# Patient Record
Sex: Female | Born: 1935 | Race: Black or African American | Hispanic: No | Marital: Married | Smoking: Current every day smoker
Health system: Southern US, Community
[De-identification: ages and names within clinical notes are randomized; demographics above are authoritative.]

---

## 2001-06-11 ENCOUNTER — Encounter: Payer: Self-pay | Admitting: *Deleted

## 2001-06-11 ENCOUNTER — Encounter: Admission: RE | Admit: 2001-06-11 | Discharge: 2001-06-11 | Payer: Self-pay | Admitting: *Deleted

## 2007-05-19 ENCOUNTER — Ambulatory Visit (HOSPITAL_COMMUNITY): Admission: RE | Admit: 2007-05-19 | Discharge: 2007-05-19 | Payer: Self-pay | Admitting: Family Medicine

## 2007-06-02 ENCOUNTER — Encounter: Admission: RE | Admit: 2007-06-02 | Discharge: 2007-06-02 | Payer: Self-pay | Admitting: Family Medicine

## 2007-06-17 ENCOUNTER — Encounter (INDEPENDENT_AMBULATORY_CARE_PROVIDER_SITE_OTHER): Payer: Self-pay | Admitting: Surgery

## 2007-06-17 ENCOUNTER — Ambulatory Visit (HOSPITAL_COMMUNITY): Admission: RE | Admit: 2007-06-17 | Discharge: 2007-06-18 | Payer: Self-pay | Admitting: Surgery

## 2007-12-17 ENCOUNTER — Encounter: Admission: RE | Admit: 2007-12-17 | Discharge: 2007-12-17 | Payer: Self-pay | Admitting: Rheumatology

## 2008-01-28 ENCOUNTER — Ambulatory Visit (HOSPITAL_BASED_OUTPATIENT_CLINIC_OR_DEPARTMENT_OTHER): Admission: RE | Admit: 2008-01-28 | Discharge: 2008-01-28 | Payer: Self-pay | Admitting: Orthopedic Surgery

## 2008-03-17 ENCOUNTER — Ambulatory Visit (HOSPITAL_BASED_OUTPATIENT_CLINIC_OR_DEPARTMENT_OTHER): Admission: RE | Admit: 2008-03-17 | Discharge: 2008-03-17 | Payer: Self-pay | Admitting: Orthopedic Surgery

## 2009-04-07 ENCOUNTER — Encounter: Admission: RE | Admit: 2009-04-07 | Discharge: 2009-04-07 | Payer: Self-pay | Admitting: Internal Medicine

## 2009-04-14 ENCOUNTER — Encounter: Admission: RE | Admit: 2009-04-14 | Discharge: 2009-04-14 | Payer: Self-pay | Admitting: Internal Medicine

## 2009-04-22 IMAGING — NM NM TUMOR LOCAL/TRACER DISTR WITH SPECT
1 series · 6 of 6 positions shown · non-contrast
Comparison: None.

CLINICAL DATA: Elevated parathyroid hormone.  Elevated calcium.  Evaluate for parathyroid adenoma. 
NM PARATHYROID SCINTIGRAPHY AND SPECT IMAGING:
TECHNIQUE: Following intravenous administration of radiopharmaceutical, early and 2-hour delayed planar images were obtained in the anterior projection.  Delayed triplanar SPECT images were also obtained at 2 hours.  
Radiopharmaceutical:  20.9 mCi 2c-99m sestamibi IV

[(hospital) non circ ect combin · 3.19mm/px · 6 of 120 frames shown]
[frame 11/120  full-range]
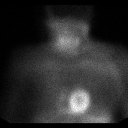
[frame 31/120  full-range]
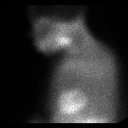
[frame 51/120  full-range]
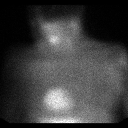
[frame 71/120  full-range]
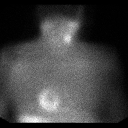
[frame 91/120  full-range]
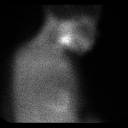
[frame 111/120  full-range]
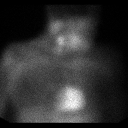

[6 of 6 positions shown; findings below may reference images not displayed]

FINDINGS: On the immediate and 60 minute delayed images there is a medium sized focus of moderate increased radiotracer uptake which localizes to the expected location of the lower pole of the right lobe of the thyroid gland.  
  Very faint radiotracer uptake in the distribution of the left lobe of the thyroid gland is noted.  
  On the 120 minute delayed images there is only mild washout of radiotracer from the expected location of the lower pole of the right lobe of the thyroid gland.
IMPRESSION: 1.  There is a medium sized focus of persistent radiotracer activity localizing to the lower pole of the right lobe of the thyroid gland.  Radiotracer uptake within a normal appearing thyroid gland is not noted.  In the appropriate clinical setting this may represent a parathyroid adenoma. 
2.  Relative decreased radiotracer activity within the upper pole of the right lobe of the thyroid and the entire left lobe of the thyroid is noted.  Correlation with thyroid disease is recommended.

## 2009-04-24 ENCOUNTER — Encounter: Admission: RE | Admit: 2009-04-24 | Discharge: 2009-04-24 | Payer: Self-pay | Admitting: Internal Medicine

## 2010-05-15 ENCOUNTER — Encounter
Admission: RE | Admit: 2010-05-15 | Discharge: 2010-05-15 | Payer: Self-pay | Source: Home / Self Care | Attending: Family Medicine | Admitting: Family Medicine

## 2010-06-03 ENCOUNTER — Encounter: Payer: Self-pay | Admitting: Internal Medicine

## 2010-09-25 NOTE — Op Note (Signed)
NAMEMARDA, Connie Becker                 ACCOUNT NO.:  [id number]   MEDICAL RECORD NO.:  [id number]          PATIENT TYPE:  OIB   LOCATION:  5120                         FACILITY:  [facility]   PHYSICIAN:  Velora Heckler, MD      DATE OF BIRTH:  Jan 13, 1936   DATE OF PROCEDURE:  06/17/2007  DATE OF DISCHARGE:                               OPERATIVE REPORT   PREOPERATIVE DIAGNOSIS:  Primary hyperparathyroidism.   POSTOPERATIVE DIAGNOSIS:  Primary hyperparathyroidism.   PROCEDURE:  Right inferior parathyroidectomy.   SURGEON:  Velora Heckler, MD, FACS   ASSISTANT:  Magnus Ivan, RNFA   ANESTHESIA:  General.   ANESTHESIOLOGIST:  Diamantina Monks, M.D.   ESTIMATED BLOOD LOSS:  Minimal.   PREPARATION:  Betadine.   COMPLICATIONS:  None.   INDICATIONS:  The patient is a 75 year old black female from [place],  [place].  She was found on routine laboratory studies to have  significant hypercalcemia.  Calcium levels have ranged from 10.5 to 13.7  with intact parathyroid hormone levels ranging from 80-151.  Sestamibi  scan performed June 02, 2007 at [facility] localized a  parathyroid adenoma to the right inferior position.  The patient now  comes to surgery for parathyroidectomy.   BODY OF REPORT:  Procedure is done in OR #16 at the [facility].  The patient is brought to the operating room and  placed in a supine position on the operating room table.  Following the  administration of general anesthesia, the patient is positioned and then  prepped and draped in the usual strict aseptic fashion.  After  ascertaining that an adequate level of anesthesia had been achieved, a  right lower neck incision is made with a #10 blade.  Dissection is  carried through subcutaneous tissues and platysma.  Hemostasis is  obtained with the electrocautery.  Subplatysmal flaps are developed  circumferentially.  A Weitlaner  retractor is placed for exposure.  Strap muscles  are incised in the  midline and reflected to the right.  Right thyroid lobe is exposed.  Dissection just beneath the inferior pole of the right lobe shows an  enlarged parathyroid gland; this is gently dissected out.  Vascular  structures are divided between small and medium Ligaclips.  Gland is  completely dissected away from the surrounding structures and excised;  it measures approximately 1.5 cm in greatest dimension.  It is submitted  fresh to Pathology.  Dr. Berneta Levins did a frozen section, confirming  parathyroid tissue consistent with parathyroid adenoma.  Good hemostasis  is noted.  A small piece of Surgicel is placed in the operative field.  Strap muscles are reapproximated in the midline with interrupted 3-0  Vicryl sutures.  Platysma is closed with interrupted 3-0 Vicryl sutures.  Skin is anesthetized with local anesthetic.  Skin is closed with a  running  4-0 Monocryl subcuticular sutures.  Wound is washed and dried and  Benzoin and Steri-Strips are applied.  Sterile dressings are applied.  The patient is awakened from anesthesia and brought to the recovery room  in  stable condition.  The patient tolerated the procedure well.      Velora Heckler, MD  Electronically Signed     TMG/MEDQ  D:  06/17/2007  T:  06/18/2007  Job:  [id number]   cc:   Deboraha Sprang at [facility], Larita Fife NP  Lavonda Jumbo, M.D.

## 2010-09-25 NOTE — Op Note (Signed)
NAME:  Connie Becker, Connie Becker                 ACCOUNT NO.:  [id number]   MEDICAL RECORD NO.:  [id number]          PATIENT TYPE:  AMB   LOCATION:  DSC                          FACILITY:  [facility]   PHYSICIAN:  Katy Fitch. Sypher, M.D. DATE OF BIRTH:  02-12-1936   DATE OF PROCEDURE:  01/28/2008  DATE OF DISCHARGE:                               OPERATIVE REPORT   PREOPERATIVE DIAGNOSIS:  Chronic carpal tunnel syndrome, right hand with  thenar atrophy and background history of possible gout.   POSTOPERATIVE DIAGNOSIS:  Chronic carpal tunnel syndrome, right hand  with thenar atrophy and background history of possible gout.   OPERATIONS:  Release of right transcarpal ligament.   OPERATIONS:  Katy Fitch. Sypher, MD   ASSISTANT:  Marveen Reeks Dasnoit, PA-C   ANESTHESIA:  General by LMA.   SUPERVISING ANESTHESIOLOGIST:  Zenon Mayo, MD   INDICATIONS:  [name] is a 75 year old woman referred through the  courtesy of Dr. Pollyann Savoy and Dr. Joselyn Arrow for evaluation of  chronic right hand pain, thenar atrophy, and loss of sensibility in the  median innervated fingers.   [name] has a history of a probable gout.  She was referred by Dr.  Lynelle Doctor to Dr. Corliss Skains for evaluation and management of hand pain and  arthritis symptoms.  Evaluation suggested possible gout and physical  examination revealed significant thenar atrophy.  Electrodiagnostic  studies were obtained, which revealed profound right carpal tunnel  syndrome and severe left carpal tunnel syndrome.   After detailed informed consent, [name] was brought to the operating  room at this time anticipating release of right transcarpal ligament.   Preoperatively, we pointed out that it would require about 5 months to  see the benefit of decompression of the carpal canal due to her severe  degree of thenar atrophy and numbness.   After informed consent, she was brought to the operating room at this  time.   PROCEDURE:  Brittani Purdum was brought to the operating room and placed in  supine position up on the table.   Following the induction of general anesthesia by LMA technique, the  right arm was prepped with Betadine soap solution and sterilely draped.  A pneumatic tourniquet was applied proximal right brachium.   Following exsanguination of the right arm with Esmarch bandage, arterial  tourniquet was inflated to 200 and 50 mmHg.   Procedure commenced with short incision in line of the ring finger of  the palm.  The subcutaneous tissues were carefully divided revealing the  palmar fascia.  This split longitudinally to the common sensory branch  of the median nerve.   These were followed back to transcarpal ligament, which was gently  isolated from median nerve.  The ligament was then released along its  ulnar border extending into distal forearm.   This widely opened carpal canal.  The tenosynovium was inspected and  found be very edematous.  This had the appearance of a chronic crystal  tenosynovitis.   The wound was then checked for bleeding points and subsequently repaired  with intradermal through a Prolene  suture.   A compressive dressing was applied with a volar plaster splint  maintaining the wrist in 5 degrees of dorsiflexion.   There were no apparent complications.      Katy Fitch Sypher, M.D.  Electronically Signed     RVS/MEDQ  D:  01/28/2008  T:  01/28/2008  Job:  [id number]   cc:   Pollyann Savoy, M.D.  Lavonda Jumbo, M.D.

## 2010-09-25 NOTE — Op Note (Signed)
NAME:  Connie, Becker                 ACCOUNT NO.:  [id number]   MEDICAL RECORD NO.:  [id number]          PATIENT TYPE:  AMB   LOCATION:  DSC                          FACILITY:  [facility]   PHYSICIAN:  Katy Fitch. Sypher, M.D. DATE OF BIRTH:  1935-09-15   DATE OF PROCEDURE:  03/17/2008  DATE OF DISCHARGE:                               OPERATIVE REPORT   PREOPERATIVE DIAGNOSIS:  Entrapment neuropathy, median nerve, left  carpal tunnel.   POSTOPERATIVE DIAGNOSIS:  Entrapment neuropathy, median nerve, left  carpal tunnel.   OPERATION:  Release of left transverse carpal ligament.   OPERATING SURGEON:  Katy Fitch. Sypher, MD   ASSISTANT:  Marveen Reeks Dasnoit, PA-C   ANESTHESIA:  General by LMA.   SUPERVISING ANESTHESIOLOGIST:  Germaine Pomfret, MD   INDICATIONS:  Connie Becker is a 75 year old woman referred for evaluation  and management of severe bilateral carpal tunnel syndrome.  She is  status post release of her right transverse carpal ligament and was  quite pleased with early result.  She now presents for similar surgery  on the left.  Preoperatively, she was advised the risks and benefits of  surgery.  She was interviewed by Dr. Jean Rosenthal who recommended general  anesthesia by LMA technique.  She was noted have no drug allergies  preoperatively.   PROCEDURE:  Connie Becker is brought to the operating room and placed in  supine position upon the operating table.   Following the induction of general anesthesia by LMA technique, the left  arm was prepped with Betadine soap and solution and sterilely draped.  A  pneumatic tourniquet was applied to the proximal left brachium.   Following exsanguination of the left arm with Esmarch bandage, the  arterial tourniquet was inflated to 220 mmHg and later elevated to 260  mmHg due to spike of systolic blood pressure.   The procedure commenced with a short incision in the line of the ring  finger in the palm.  Subcutaneous tissues were carefully  divided  revealing the palmar fascia.  This was split longitudinally to reveal  the common sensory branch of median nerve.  These were followed back to  the median nerve proper.  The ulnar bursa was inspected and found to be  rather wet and inflammatory.  Scissors were used to release the ulnar  aspect of the transverse carpal ligament followed by release into the  distal forearm, widely opening the carpal canal.  Once again, a  inflammatory wet tenosynovitis was noted.   The wound was inspected for bleeding points and subsequently repaired  with intradermal 3-0 Prolene suture.  A compressive dressing was applied  with a volar plaster splint maintaining the wrist in 5 degrees of  dorsiflexion.   There were no apparent complications.  Connie Becker tolerated the surgery  and anesthesia well.  She was transferred to the recovery room with  stable vital signs.   She will be discharged with prescription for Percocet 5 mg one p.o. q.4-  6 hours p.r.n. pain, 20 tablets without refill.      Katy Fitch Sypher, M.D.  Electronically Signed     RVS/MEDQ  D:  03/17/2008  T:  03/17/2008  Job:  [id number]

## 2011-02-01 LAB — URINALYSIS, ROUTINE W REFLEX MICROSCOPIC
Ketones, ur: NEGATIVE
Nitrite: POSITIVE — AB
Specific Gravity, Urine: 1.008
pH: 8

## 2011-02-01 LAB — CALCIUM: Calcium: 9.1

## 2011-02-01 LAB — URINE MICROSCOPIC-ADD ON

## 2011-02-01 LAB — CBC
HCT: 34 — ABNORMAL LOW
Hemoglobin: 11.3 — ABNORMAL LOW
MCHC: 33.3
RDW: 15.5

## 2011-02-01 LAB — COMPREHENSIVE METABOLIC PANEL
Albumin: 4.4
BUN: 13
Calcium: 11.5 — ABNORMAL HIGH
Creatinine, Ser: 0.76
Total Protein: 8.2

## 2011-02-01 LAB — DIFFERENTIAL
Basophils Relative: 1
Lymphocytes Relative: 25
Lymphs Abs: 2
Monocytes Relative: 8
Neutro Abs: 5.2
Neutrophils Relative %: 65

## 2011-02-01 LAB — PROTIME-INR: INR: 1

## 2011-05-27 ENCOUNTER — Other Ambulatory Visit: Payer: Self-pay | Admitting: Family Medicine

## 2011-05-27 DIAGNOSIS — Z1231 Encounter for screening mammogram for malignant neoplasm of breast: Secondary | ICD-10-CM

## 2011-06-14 ENCOUNTER — Ambulatory Visit
Admission: RE | Admit: 2011-06-14 | Discharge: 2011-06-14 | Disposition: A | Discharge status: Home / Self Care | Payer: Medicare Other | Source: Ambulatory Visit | Attending: Family Medicine | Admitting: Family Medicine

## 2011-06-14 DIAGNOSIS — Z1231 Encounter for screening mammogram for malignant neoplasm of breast: Secondary | ICD-10-CM

## 2012-01-07 ENCOUNTER — Other Ambulatory Visit: Payer: Self-pay | Admitting: Family Medicine

## 2012-01-07 DIAGNOSIS — Z1231 Encounter for screening mammogram for malignant neoplasm of breast: Secondary | ICD-10-CM

## 2012-01-07 DIAGNOSIS — Z780 Asymptomatic menopausal state: Secondary | ICD-10-CM

## 2012-09-14 ENCOUNTER — Ambulatory Visit
Admission: RE | Admit: 2012-09-14 | Discharge: 2012-09-14 | Disposition: A | Discharge status: Home / Self Care | Payer: Medicare Other | Source: Ambulatory Visit | Attending: Family Medicine | Admitting: Family Medicine

## 2012-09-14 DIAGNOSIS — Z780 Asymptomatic menopausal state: Secondary | ICD-10-CM

## 2012-09-14 DIAGNOSIS — Z1231 Encounter for screening mammogram for malignant neoplasm of breast: Secondary | ICD-10-CM

## 2015-08-16 ENCOUNTER — Other Ambulatory Visit: Payer: Self-pay | Admitting: Family Medicine

## 2015-08-16 DIAGNOSIS — I10 Essential (primary) hypertension: Secondary | ICD-10-CM

## 2015-08-21 ENCOUNTER — Ambulatory Visit
Admission: RE | Admit: 2015-08-21 | Discharge: 2015-08-21 | Disposition: A | Discharge status: Home / Self Care | Payer: Medicare Other | Source: Ambulatory Visit | Attending: Family Medicine | Admitting: Family Medicine

## 2015-08-21 DIAGNOSIS — I10 Essential (primary) hypertension: Secondary | ICD-10-CM

## 2017-06-22 ENCOUNTER — Inpatient Hospital Stay (HOSPITAL_COMMUNITY)
Admission: EM | Admit: 2017-06-22 | Discharge: 2017-06-27 | DRG: 194 | Disposition: A | Discharge status: Home / Self Care | Payer: Medicare Other | Attending: Internal Medicine | Admitting: Internal Medicine

## 2017-06-22 ENCOUNTER — Encounter (HOSPITAL_COMMUNITY): Payer: Self-pay

## 2017-06-22 ENCOUNTER — Other Ambulatory Visit: Payer: Self-pay

## 2017-06-22 ENCOUNTER — Emergency Department (HOSPITAL_COMMUNITY): Payer: Medicare Other

## 2017-06-22 DIAGNOSIS — J1008 Influenza due to other identified influenza virus with other specified pneumonia: Principal | ICD-10-CM | POA: Diagnosis present

## 2017-06-22 DIAGNOSIS — E860 Dehydration: Secondary | ICD-10-CM | POA: Diagnosis present

## 2017-06-22 DIAGNOSIS — R059 Cough, unspecified: Secondary | ICD-10-CM

## 2017-06-22 DIAGNOSIS — E871 Hypo-osmolality and hyponatremia: Secondary | ICD-10-CM | POA: Diagnosis present

## 2017-06-22 DIAGNOSIS — Z716 Tobacco abuse counseling: Secondary | ICD-10-CM

## 2017-06-22 DIAGNOSIS — J14 Pneumonia due to Hemophilus influenzae: Secondary | ICD-10-CM | POA: Diagnosis present

## 2017-06-22 DIAGNOSIS — R0602 Shortness of breath: Secondary | ICD-10-CM | POA: Diagnosis present

## 2017-06-22 DIAGNOSIS — D72829 Elevated white blood cell count, unspecified: Secondary | ICD-10-CM | POA: Diagnosis present

## 2017-06-22 DIAGNOSIS — Z720 Tobacco use: Secondary | ICD-10-CM

## 2017-06-22 DIAGNOSIS — Z79899 Other long term (current) drug therapy: Secondary | ICD-10-CM

## 2017-06-22 DIAGNOSIS — Z79890 Hormone replacement therapy (postmenopausal): Secondary | ICD-10-CM

## 2017-06-22 DIAGNOSIS — R0603 Acute respiratory distress: Secondary | ICD-10-CM | POA: Diagnosis present

## 2017-06-22 DIAGNOSIS — I10 Essential (primary) hypertension: Secondary | ICD-10-CM | POA: Diagnosis present

## 2017-06-22 DIAGNOSIS — E039 Hypothyroidism, unspecified: Secondary | ICD-10-CM | POA: Diagnosis present

## 2017-06-22 DIAGNOSIS — R0600 Dyspnea, unspecified: Secondary | ICD-10-CM

## 2017-06-22 DIAGNOSIS — R05 Cough: Secondary | ICD-10-CM

## 2017-06-22 DIAGNOSIS — J101 Influenza due to other identified influenza virus with other respiratory manifestations: Secondary | ICD-10-CM | POA: Diagnosis present

## 2017-06-22 DIAGNOSIS — K219 Gastro-esophageal reflux disease without esophagitis: Secondary | ICD-10-CM | POA: Diagnosis present

## 2017-06-22 DIAGNOSIS — R0902 Hypoxemia: Secondary | ICD-10-CM

## 2017-06-22 DIAGNOSIS — J189 Pneumonia, unspecified organism: Secondary | ICD-10-CM | POA: Diagnosis present

## 2017-06-22 LAB — COMPREHENSIVE METABOLIC PANEL
ALT: 32 U/L (ref 14–54)
ANION GAP: 8 (ref 5–15)
AST: 59 U/L — ABNORMAL HIGH (ref 15–41)
Albumin: 3.8 g/dL (ref 3.5–5.0)
Alkaline Phosphatase: 93 U/L (ref 38–126)
BUN: 13 mg/dL (ref 6–20)
CO2: 26 mmol/L (ref 22–32)
CREATININE: 0.68 mg/dL (ref 0.44–1.00)
Calcium: 9.1 mg/dL (ref 8.9–10.3)
Chloride: 91 mmol/L — ABNORMAL LOW (ref 101–111)
GFR calc non Af Amer: >60 mL/min (ref >60)
Glucose, Bld: 146 mg/dL — ABNORMAL HIGH (ref 65–99)
Potassium: 4.7 mmol/L (ref 3.5–5.1)
SODIUM: 125 mmol/L — AB (ref 135–145)
Total Bilirubin: 0.5 mg/dL (ref 0.3–1.2)
Total Protein: 7.5 g/dL (ref 6.5–8.1)

## 2017-06-22 LAB — CBC WITH DIFFERENTIAL/PLATELET
BASOS PCT: 0 %
Band Neutrophils: 20 %
Basophils Absolute: 0 10*3/uL (ref 0.0–0.1)
Blasts: 0 %
EOS PCT: 0 %
Eosinophils Absolute: 0 10*3/uL (ref 0.0–0.7)
HCT: 31.2 % — ABNORMAL LOW (ref 36.0–46.0)
Hemoglobin: 10.9 g/dL — ABNORMAL LOW (ref 12.0–15.0)
LYMPHS ABS: 0.7 10*3/uL (ref 0.7–4.0)
LYMPHS PCT: 5 %
MCH: 31.5 pg (ref 26.0–34.0)
MCHC: 34.9 g/dL (ref 30.0–36.0)
MCV: 90.2 fL (ref 78.0–100.0)
MONO ABS: 0.4 10*3/uL (ref 0.1–1.0)
MONOS PCT: 3 %
Metamyelocytes Relative: 3 %
Myelocytes: 0 %
NEUTROS PCT: 69 %
NRBC: 0 /100{WBCs}
Neutro Abs: 12.6 10*3/uL — ABNORMAL HIGH (ref 1.7–7.7)
OTHER: 0 %
PLATELETS: 176 10*3/uL (ref 150–400)
Promyelocytes Absolute: 0 %
RBC: 3.46 MIL/uL — AB (ref 3.87–5.11)
RDW: 14.2 % (ref 11.5–15.5)
WBC Morphology: INCREASED
WBC: 13.7 10*3/uL — AB (ref 4.0–10.5)

## 2017-06-22 LAB — I-STAT TROPONIN, ED: Troponin i, poc: 0.03 ng/mL (ref 0.00–0.08)

## 2017-06-22 LAB — URINALYSIS, ROUTINE W REFLEX MICROSCOPIC
Bilirubin Urine: NEGATIVE
GLUCOSE, UA: 50 mg/dL — AB
HGB URINE DIPSTICK: NEGATIVE
KETONES UR: 5 mg/dL — AB
Leukocytes, UA: NEGATIVE
NITRITE: NEGATIVE
PROTEIN: 30 mg/dL — AB
Specific Gravity, Urine: 1.016 (ref 1.005–1.030)
pH: 5 (ref 5.0–8.0)

## 2017-06-22 LAB — INFLUENZA PANEL BY PCR (TYPE A & B)
INFLAPCR: POSITIVE — AB
Influenza B By PCR: NEGATIVE

## 2017-06-22 LAB — I-STAT CG4 LACTIC ACID, ED: LACTIC ACID, VENOUS: 1.63 mmol/L (ref 0.5–1.9)

## 2017-06-22 MED ORDER — OSELTAMIVIR PHOSPHATE 75 MG PO CAPS
75.0000 mg | ORAL_CAPSULE | Freq: Once | ORAL | Status: AC
  Administered 2017-06-23: 75 mg via ORAL
  Filled 2017-06-22: qty 1

## 2017-06-22 MED ORDER — DEXTROSE 5 % IV SOLN
1.0000 g | Freq: Once | INTRAVENOUS | Status: AC
  Administered 2017-06-23: 1 g via INTRAVENOUS
  Filled 2017-06-22: qty 10

## 2017-06-22 MED ORDER — DEXTROSE 5 % IV SOLN
500.0000 mg | Freq: Once | INTRAVENOUS | Status: AC
  Administered 2017-06-22: 500 mg via INTRAVENOUS
  Filled 2017-06-22: qty 500

## 2017-06-22 NOTE — ED Triage Notes (Signed)
Pt reports cough and congestion since Friday. Denies fever. No N/V. O2 at 85% in triage. She does not wear O2 at home. A&Ox4.

## 2017-06-22 NOTE — ED Provider Notes (Signed)
[facility]-EMERGENCY DEPT Provider Note   CSN: [id number] Arrival date & time: 06/22/17  1945     History   Chief Complaint Chief Complaint  Patient presents with  . Cough  . Nasal Congestion    HPI Connie Becker is a 82 y.o. female who presents today for evaluation of cough and nasal congestion since Friday.  Connie Becker reports subjective fevers at home, denies nausea, vomiting, or diarrhea.  Connie Becker reports that Connie Becker just feels globally weak.  Connie Becker reports that her cough has been interfering with her sleep.  Connie Becker is still maintaining good oral intake.  Connie Becker denies any history of smoking or secondhand smoke exposure.  No chest pain.  Connie Becker denies recent sick contacts.  Connie Becker denies headache.  No personal history of blood clots.  Denies cardiac history.    HPI  History reviewed. No pertinent past medical history.  There are no active problems to display for this patient.   History reviewed. No pertinent surgical history.  OB History    No data available       Home Medications    Prior to Admission medications   Not on File    Family History History reviewed. No pertinent family history.  Social History Social History   Tobacco Use  . Smoking status: Not on file  Substance Use Topics  . Alcohol use: Not on file  . Drug use: Not on file     Allergies   Patient has no known allergies.   Review of Systems Review of Systems  Constitutional: Positive for fever (Subjective). Negative for chills and diaphoresis.  HENT: Positive for congestion, postnasal drip and rhinorrhea. Negative for sore throat.   Eyes: Negative for visual disturbance.  Respiratory: Positive for cough and shortness of breath. Negative for chest tightness and wheezing.   Cardiovascular: Negative for chest pain, palpitations and leg swelling.  Gastrointestinal: Negative for abdominal pain and nausea.  Genitourinary: Negative for dysuria.  Musculoskeletal: Negative for arthralgias, back  pain and neck pain.  Skin: Negative for color change.  Neurological: Positive for weakness (Generally feeling weak and not well. ). Negative for headaches.  Psychiatric/Behavioral: Negative for confusion.  All other systems reviewed and are negative.    Physical Exam Updated Vital Signs BP (!) 147/90   Pulse 81   Temp 98.9 F (37.2 C) (Oral)   Resp (!) 23   Ht 5\' 1"  (1.549 m)   Wt 52.2 kg (115 lb)   SpO2 97%   BMI 21.73 kg/m   Physical Exam  Constitutional: Connie Becker appears well-developed and well-nourished. No distress.  HENT:  Head: Normocephalic and atraumatic.  Eyes: Conjunctivae are normal.  Neck: Neck supple.  Cardiovascular: Normal rate and regular rhythm.  No murmur heard. Pulmonary/Chest: Accessory muscle usage (Slight) present. Connie Becker has rhonchi in the right middle field, the right lower field, the left middle field and the left lower field. Connie Becker has rales in the right middle field, the right lower field, the left middle field and the left lower field.  Abdominal: Soft. Bowel sounds are normal. There is no tenderness.  Musculoskeletal: Connie Becker exhibits no edema.  No edema to bilateral lower extremities.   Neurological: Connie Becker is alert. GCS eye subscore is 4. GCS verbal subscore is 5. GCS motor subscore is 6.  Skin: Skin is warm and dry.  Psychiatric: Connie Becker has a normal mood and affect.  Nursing note and vitals reviewed.    ED Treatments / Results  Labs (all labs ordered are  listed, but only abnormal results are displayed) Labs Reviewed  COMPREHENSIVE METABOLIC PANEL - Abnormal; Notable for the following components:      Result Value   Sodium 125 (*)    Chloride 91 (*)    Glucose, Bld 146 (*)    AST 59 (*)    All other components within normal limits  CBC WITH DIFFERENTIAL/PLATELET - Abnormal; Notable for the following components:   WBC 13.7 (*)    RBC 3.46 (*)    Hemoglobin 10.9 (*)    HCT 31.2 (*)    All other components within normal limits  URINALYSIS, ROUTINE W  REFLEX MICROSCOPIC - Abnormal; Notable for the following components:   APPearance HAZY (*)    Glucose, UA 50 (*)    Ketones, ur 5 (*)    Protein, ur 30 (*)    Bacteria, UA RARE (*)    Squamous Epithelial / LPF 6-30 (*)    All other components within normal limits  INFLUENZA PANEL BY PCR (TYPE A & B) - Abnormal; Notable for the following components:   Influenza A By PCR POSITIVE (*)    All other components within normal limits  CULTURE, BLOOD (ROUTINE X 2)  CULTURE, BLOOD (ROUTINE X 2)  BRAIN NATRIURETIC PEPTIDE  I-STAT CG4 LACTIC ACID, ED  I-STAT TROPONIN, ED    EKG  EKG Interpretation  Date/Time:  Sunday June 22 2017 20:46:32 EST Ventricular Rate:  81 PR Interval:    QRS Duration: 89 QT Interval:  378 QTC Calculation: 439 R Axis:   74 Text Interpretation:  Sinus rhythm Probable left atrial enlargement Confirmed by Raeford Razor [phone]) on 06/22/2017 11:18:14 PM       Radiology Dg Chest 2 View  Result Date: 06/22/2017 CLINICAL DATA:  Cough and congestion EXAM: CHEST  2 VIEW COMPARISON:  December 17, 2007 FINDINGS: There is patchy airspace opacity in both mid and lower lung zones. Heart is upper normal in size with pulmonary vascularity within normal limits. No adenopathy. There is aortic atherosclerosis. No evident bone lesions. IMPRESSION: Patchy infiltrate in both mid and lower lung zones consistent with multifocal pneumonia. Heart upper normal in size. No evident adenopathy. There is aortic atherosclerosis. Aortic Atherosclerosis (ICD10-I70.0). Followup PA and lateral chest radiographs recommended in 3-4 weeks following trial of antibiotic therapy to ensure resolution and exclude underlying malignancy. Electronically Signed   By: Bretta Bang III M.D.   On: 06/22/2017 21:26    Procedures Procedures (including critical care time)  Medications Ordered in ED Medications  cefTRIAXone (ROCEPHIN) 1 g in dextrose 5 % 50 mL IVPB (not administered)  oseltamivir (TAMIFLU)  capsule 75 mg (not administered)  azithromycin (ZITHROMAX) 500 mg in dextrose 5 % 250 mL IVPB (0 mg Intravenous Stopped 06/22/17 2306)     Initial Impression / Assessment and Plan / ED Course  I have reviewed the triage vital signs and the nursing notes.  Pertinent labs & imaging results that were available during my care of the patient were reviewed by me and considered in my medical decision making (see chart for details).  Clinical Course as of Jun 23 2315  Wynelle Link Jun 22, 2017  2102 Attempted to see patient, in x-ray  [EH]  2313 Spoke with hospitalist who agreed to admit patient.   [EH]    Clinical Course User Index [EH] Doryce, Mcgregory today for evaluation of cough, nasal congestion, and generally not feeling well.  Connie Becker was found to be hypoxic  on arrival, 83-85% on room air.  Connie Becker does not normally have a oxygen requirement.  Chest x-ray was obtained showing bilateral lower and middle lobe multifocal pneumonia.  Flu swab positive for influenza A.  Orders placed for Tamiflu, azithromycin, and Rocephin.  White count mildly elevated at 13.7, hemoglobin 10.9.  BNP was ordered and pending at time of admission, clinically Connie Becker is not fluid overloaded.  Initial troponin negative, lactic acid normal.  Connie Becker is afebrile here.  EKG obtained and reviewed.  Connie Becker was noted to be hyponatremic with a sodium of 125.    I spoke with hospitalist who agreed to admit patient given new oxygen requirement, hyponatremia.    Final Clinical Impressions(s) / ED Diagnoses   Final diagnoses:  Hyponatremia  Hypoxemia    ED Discharge Orders    None       Norman ClayHammond, Claudis Giovanelli W, PA-C 06/22/17 2318    Raeford RazorKohut, Stephen, MD 06/22/17 [phone]

## 2017-06-23 ENCOUNTER — Encounter (HOSPITAL_COMMUNITY): Payer: Self-pay

## 2017-06-23 DIAGNOSIS — D72829 Elevated white blood cell count, unspecified: Secondary | ICD-10-CM | POA: Diagnosis present

## 2017-06-23 DIAGNOSIS — R0609 Other forms of dyspnea: Secondary | ICD-10-CM | POA: Diagnosis not present

## 2017-06-23 DIAGNOSIS — I10 Essential (primary) hypertension: Secondary | ICD-10-CM | POA: Diagnosis present

## 2017-06-23 DIAGNOSIS — Z79899 Other long term (current) drug therapy: Secondary | ICD-10-CM | POA: Diagnosis not present

## 2017-06-23 DIAGNOSIS — E860 Dehydration: Secondary | ICD-10-CM | POA: Diagnosis present

## 2017-06-23 DIAGNOSIS — R0603 Acute respiratory distress: Secondary | ICD-10-CM | POA: Diagnosis present

## 2017-06-23 DIAGNOSIS — E039 Hypothyroidism, unspecified: Secondary | ICD-10-CM | POA: Diagnosis present

## 2017-06-23 DIAGNOSIS — Z716 Tobacco abuse counseling: Secondary | ICD-10-CM | POA: Diagnosis not present

## 2017-06-23 DIAGNOSIS — J14 Pneumonia due to Hemophilus influenzae: Secondary | ICD-10-CM | POA: Diagnosis present

## 2017-06-23 DIAGNOSIS — E871 Hypo-osmolality and hyponatremia: Secondary | ICD-10-CM | POA: Diagnosis present

## 2017-06-23 DIAGNOSIS — J1008 Influenza due to other identified influenza virus with other specified pneumonia: Secondary | ICD-10-CM | POA: Diagnosis present

## 2017-06-23 DIAGNOSIS — Z720 Tobacco use: Secondary | ICD-10-CM | POA: Diagnosis not present

## 2017-06-23 DIAGNOSIS — J101 Influenza due to other identified influenza virus with other respiratory manifestations: Secondary | ICD-10-CM | POA: Diagnosis not present

## 2017-06-23 DIAGNOSIS — K219 Gastro-esophageal reflux disease without esophagitis: Secondary | ICD-10-CM | POA: Diagnosis present

## 2017-06-23 DIAGNOSIS — Z79890 Hormone replacement therapy (postmenopausal): Secondary | ICD-10-CM | POA: Diagnosis not present

## 2017-06-23 DIAGNOSIS — R0602 Shortness of breath: Secondary | ICD-10-CM | POA: Diagnosis present

## 2017-06-23 DIAGNOSIS — J189 Pneumonia, unspecified organism: Secondary | ICD-10-CM | POA: Diagnosis present

## 2017-06-23 DIAGNOSIS — R0902 Hypoxemia: Secondary | ICD-10-CM | POA: Diagnosis present

## 2017-06-23 LAB — BLOOD GAS, ARTERIAL
Acid-base deficit: 4.4 mmol/L — ABNORMAL HIGH (ref 0.0–2.0)
Bicarbonate: 20.8 mmol/L (ref 20.0–28.0)
Drawn by: 514251
FIO2: 40
O2 Saturation: 83.6 %
PCO2 ART: 41.7 mmHg (ref 32.0–48.0)
PH ART: 7.32 — AB (ref 7.350–7.450)
PO2 ART: 53.2 mmHg — AB (ref 83.0–108.0)
Patient temperature: 98.6

## 2017-06-23 LAB — COMPREHENSIVE METABOLIC PANEL WITH GFR
ALT: 39 U/L (ref 14–54)
AST: 80 U/L — ABNORMAL HIGH (ref 15–41)
Albumin: 3.3 g/dL — ABNORMAL LOW (ref 3.5–5.0)
Alkaline Phosphatase: 83 U/L (ref 38–126)
Anion gap: 13 (ref 5–15)
BUN: 10 mg/dL (ref 6–20)
CO2: 21 mmol/L — ABNORMAL LOW (ref 22–32)
Calcium: 8.4 mg/dL — ABNORMAL LOW (ref 8.9–10.3)
Chloride: 90 mmol/L — ABNORMAL LOW (ref 101–111)
Creatinine, Ser: 0.58 mg/dL (ref 0.44–1.00)
GFR calc Af Amer: >60 mL/min
GFR calc non Af Amer: >60 mL/min
Glucose, Bld: 190 mg/dL — ABNORMAL HIGH (ref 65–99)
Potassium: 3.5 mmol/L (ref 3.5–5.1)
Sodium: 124 mmol/L — ABNORMAL LOW (ref 135–145)
Total Bilirubin: 0.5 mg/dL (ref 0.3–1.2)
Total Protein: 6.6 g/dL (ref 6.5–8.1)

## 2017-06-23 LAB — CBC WITH DIFFERENTIAL/PLATELET
Basophils Absolute: 0 K/uL (ref 0.0–0.1)
Basophils Relative: 0 %
Eosinophils Absolute: 0.1 K/uL (ref 0.0–0.7)
Eosinophils Relative: 1 %
HCT: 28.6 % — ABNORMAL LOW (ref 36.0–46.0)
Hemoglobin: 10.1 g/dL — ABNORMAL LOW (ref 12.0–15.0)
Lymphocytes Relative: 4 %
Lymphs Abs: 0.3 K/uL — ABNORMAL LOW (ref 0.7–4.0)
MCH: 31.5 pg (ref 26.0–34.0)
MCHC: 35.3 g/dL (ref 30.0–36.0)
MCV: 89.1 fL (ref 78.0–100.0)
Monocytes Absolute: 0.3 K/uL (ref 0.1–1.0)
Monocytes Relative: 3 %
Neutro Abs: 8 K/uL — ABNORMAL HIGH (ref 1.7–7.7)
Neutrophils Relative %: 92 %
Platelets: 168 K/uL (ref 150–400)
RBC: 3.21 MIL/uL — ABNORMAL LOW (ref 3.87–5.11)
RDW: 14 % (ref 11.5–15.5)
WBC Morphology: INCREASED
WBC: 8.7 K/uL (ref 4.0–10.5)

## 2017-06-23 LAB — MRSA PCR SCREENING: MRSA by PCR: NEGATIVE

## 2017-06-23 LAB — CREATININE, SERUM
CREATININE: 0.61 mg/dL (ref 0.44–1.00)
GFR calc Af Amer: >60 mL/min (ref >60)
GFR calc non Af Amer: >60 mL/min (ref >60)

## 2017-06-23 LAB — STREP PNEUMONIAE URINARY ANTIGEN: Strep Pneumo Urinary Antigen: NEGATIVE

## 2017-06-23 LAB — BRAIN NATRIURETIC PEPTIDE
B NATRIURETIC PEPTIDE 5: 593.1 pg/mL — AB (ref 0.0–100.0)
B Natriuretic Peptide: 772.4 pg/mL — ABNORMAL HIGH (ref 0.0–100.0)

## 2017-06-23 LAB — MAGNESIUM: Magnesium: 1.4 mg/dL — ABNORMAL LOW (ref 1.7–2.4)

## 2017-06-23 MED ORDER — POTASSIUM CHLORIDE CRYS ER 20 MEQ PO TBCR
40.0000 meq | EXTENDED_RELEASE_TABLET | Freq: Once | ORAL | Status: AC
  Administered 2017-06-23: 40 meq via ORAL
  Filled 2017-06-23: qty 2

## 2017-06-23 MED ORDER — IPRATROPIUM-ALBUTEROL 0.5-2.5 (3) MG/3ML IN SOLN
3.0000 mL | RESPIRATORY_TRACT | Status: DC
  Administered 2017-06-23 (×3): 3 mL via RESPIRATORY_TRACT
  Filled 2017-06-23 (×3): qty 3

## 2017-06-23 MED ORDER — SODIUM CHLORIDE 0.9 % IV SOLN
500.0000 mg | INTRAVENOUS | Status: DC
  Administered 2017-06-23 – 2017-06-26 (×4): 500 mg via INTRAVENOUS
  Filled 2017-06-23 (×4): qty 500

## 2017-06-23 MED ORDER — ADULT MULTIVITAMIN W/MINERALS CH
ORAL_TABLET | Freq: Every evening | ORAL | Status: DC
  Administered 2017-06-23 – 2017-06-26 (×4): 1 via ORAL
  Filled 2017-06-23 (×4): qty 1

## 2017-06-23 MED ORDER — ENOXAPARIN SODIUM 40 MG/0.4ML ~~LOC~~ SOLN
40.0000 mg | SUBCUTANEOUS | Status: DC
  Administered 2017-06-23 – 2017-06-26 (×4): 40 mg via SUBCUTANEOUS
  Filled 2017-06-23 (×5): qty 0.4

## 2017-06-23 MED ORDER — IPRATROPIUM-ALBUTEROL 0.5-2.5 (3) MG/3ML IN SOLN
3.0000 mL | Freq: Four times a day (QID) | RESPIRATORY_TRACT | Status: DC
  Administered 2017-06-23 (×2): 3 mL via RESPIRATORY_TRACT
  Filled 2017-06-23 (×3): qty 3

## 2017-06-23 MED ORDER — SODIUM CHLORIDE 0.9 % IV SOLN
1.0000 g | INTRAVENOUS | Status: DC
  Administered 2017-06-23 – 2017-06-27 (×4): 1 g via INTRAVENOUS
  Filled 2017-06-23 (×4): qty 1

## 2017-06-23 MED ORDER — MAGNESIUM OXIDE 400 (241.3 MG) MG PO TABS
800.0000 mg | ORAL_TABLET | Freq: Two times a day (BID) | ORAL | Status: AC
  Administered 2017-06-23 – 2017-06-24 (×2): 800 mg via ORAL
  Filled 2017-06-23 (×2): qty 2

## 2017-06-23 MED ORDER — FUROSEMIDE 10 MG/ML IJ SOLN
80.0000 mg | Freq: Once | INTRAMUSCULAR | Status: AC
  Administered 2017-06-23: 80 mg via INTRAVENOUS
  Filled 2017-06-23: qty 8

## 2017-06-23 MED ORDER — METHYLPREDNISOLONE SODIUM SUCC 125 MG IJ SOLR
60.0000 mg | Freq: Once | INTRAMUSCULAR | Status: AC
  Administered 2017-06-23: 60 mg via INTRAVENOUS
  Filled 2017-06-23: qty 2

## 2017-06-23 MED ORDER — DEXTROSE 5 % IV SOLN: 1.0000 g | INTRAVENOUS | Status: DC

## 2017-06-23 MED ORDER — SODIUM CHLORIDE 0.9 % IV SOLN
INTRAVENOUS | Status: DC
  Administered 2017-06-23: 04:00:00 via INTRAVENOUS

## 2017-06-23 MED ORDER — SODIUM CHLORIDE 0.9 % IV SOLN
INTRAVENOUS | Status: DC
  Administered 2017-06-23: 11:00:00 via INTRAVENOUS

## 2017-06-23 MED ORDER — AMLODIPINE BESYLATE 5 MG PO TABS
5.0000 mg | ORAL_TABLET | Freq: Every evening | ORAL | Status: DC
  Administered 2017-06-23 – 2017-06-26 (×4): 5 mg via ORAL
  Filled 2017-06-23 (×4): qty 1

## 2017-06-23 MED ORDER — OSELTAMIVIR PHOSPHATE 30 MG PO CAPS
30.0000 mg | ORAL_CAPSULE | Freq: Two times a day (BID) | ORAL | Status: AC
  Administered 2017-06-23 – 2017-06-27 (×9): 30 mg via ORAL
  Filled 2017-06-23 (×10): qty 1

## 2017-06-23 MED ORDER — LEVOTHYROXINE SODIUM 75 MCG PO TABS
75.0000 ug | ORAL_TABLET | Freq: Every day | ORAL | Status: DC
  Administered 2017-06-23 – 2017-06-27 (×5): 75 ug via ORAL
  Filled 2017-06-23 (×5): qty 1

## 2017-06-23 MED ORDER — IPRATROPIUM-ALBUTEROL 0.5-2.5 (3) MG/3ML IN SOLN
3.0000 mL | RESPIRATORY_TRACT | Status: DC | PRN
  Administered 2017-06-23 (×2): 3 mL via RESPIRATORY_TRACT
  Filled 2017-06-23: qty 3

## 2017-06-23 MED ORDER — METHYLPREDNISOLONE SODIUM SUCC 40 MG IJ SOLR
40.0000 mg | Freq: Three times a day (TID) | INTRAMUSCULAR | Status: DC
  Administered 2017-06-23 – 2017-06-24 (×3): 40 mg via INTRAVENOUS
  Filled 2017-06-23 (×3): qty 1

## 2017-06-23 MED ORDER — DEXTROSE 5 % IV SOLN: 500.0000 mg | INTRAVENOUS | Status: DC

## 2017-06-23 NOTE — ED Notes (Signed)
ADMITTING Provider at bedside. 

## 2017-06-23 NOTE — ED Notes (Signed)
Connie DuverneyPastor- Marcia Isley [phone]

## 2017-06-23 NOTE — ED Notes (Addendum)
Pt is experiencing increased WOB O2 increased from 4 to 5 L/min. Sats in low 90s. Paged hospitalist.

## 2017-06-23 NOTE — ED Notes (Signed)
ED TO INPATIENT HANDOFF REPORT  Name/Age/Gender Connie Becker 82 y.o. female  Code Status    Code Status Orders  (From admission, onward)        Start     Ordered   06/23/17 0728  Full code  Continuous     06/23/17 0728    Code Status History    Date Active Date Inactive Code Status Order ID Comments User Context   06/23/2017 07:28 06/23/2017 07:28 Full Code [id number]  Elwyn Reach, MD ED      Home/SNF/Other Home  Chief Complaint Flu Like Symptoms   Level of Care/Admitting Diagnosis ED Disposition    ED Disposition Condition [place]: [facility] [[id number]]  Level of Care: Stepdown [14]  Admit to SDU based on following criteria: Hemodynamic compromise or significant risk of instability:  Patient requiring short term acute titration and management of vasoactive drips, and invasive monitoring (i.e., CVP and Arterial line).  Diagnosis: SOB (shortness of breath) [035009]  Admitting Physician: Gerlean Ren [facility] [[id number]]  Attending Physician: Gerlean Ren [facility] [[id number]]  Estimated length of stay: past midnight tomorrow  Certification:: I certify this patient will need inpatient services for at least 2 midnights  PT Class (Do Not Modify): Inpatient [101]  PT Acc Code (Do Not Modify): Private [1]       Medical History History reviewed. No pertinent past medical history.  Allergies No Known Allergies  IV Location/Drains/Wounds Patient Lines/Drains/Airways Status   Active Line/Drains/Airways    Name:   Placement date:   Placement time:   Site:   Days:   Peripheral IV 06/22/17 Right Antecubital   06/22/17    2125    Antecubital   1   Peripheral IV 06/23/17 Right Forearm   06/23/17    0733    Forearm   less than 1          Labs/Imaging Results for orders placed or performed during the hospital encounter of 06/22/17 (from the past 48 hour(s))  Comprehensive metabolic panel     Status: Abnormal   Collection Time:  06/22/17  9:04 PM  Result Value Ref Range   Sodium 125 (L) 135 - 145 mmol/L   Potassium 4.7 3.5 - 5.1 mmol/L   Chloride 91 (L) 101 - 111 mmol/L   CO2 26 22 - 32 mmol/L   Glucose, Bld 146 (H) 65 - 99 mg/dL   BUN 13 6 - 20 mg/dL   Creatinine, Ser 0.68 0.44 - 1.00 mg/dL   Calcium 9.1 8.9 - 10.3 mg/dL   Total Protein 7.5 6.5 - 8.1 g/dL   Albumin 3.8 3.5 - 5.0 g/dL   AST 59 (H) 15 - 41 U/L   ALT 32 14 - 54 U/L   Alkaline Phosphatase 93 38 - 126 U/L   Total Bilirubin 0.5 0.3 - 1.2 mg/dL   GFR calc non Af Amer >60 >60 mL/min   GFR calc Af Amer >60 >60 mL/min    Comment: (NOTE) The eGFR has been calculated using the CKD EPI equation. This calculation has not been validated in all clinical situations. eGFR's persistently <60 mL/min signify possible Chronic Kidney Disease.    Anion gap 8 5 - 15    Comment: Performed at [facility], [place] [address] [place], [place] [address]  CBC with Differential     Status: Abnormal   Collection Time: 06/22/17  9:04 PM  Result Value Ref Range   WBC 13.7 (  H) 4.0 - 10.5 K/uL   RBC 3.46 (L) 3.87 - 5.11 MIL/uL   Hemoglobin 10.9 (L) 12.0 - 15.0 g/dL   HCT 31.2 (L) 36.0 - 46.0 %   MCV 90.2 78.0 - 100.0 fL   MCH 31.5 26.0 - 34.0 pg   MCHC 34.9 30.0 - 36.0 g/dL   RDW 14.2 11.5 - 15.5 %   Platelets 176 150 - 400 K/uL   Neutrophils Relative % 69 %   Lymphocytes Relative 5 %   Monocytes Relative 3 %   Eosinophils Relative 0 %   Basophils Relative 0 %   Band Neutrophils 20 %   Metamyelocytes Relative 3 %   Myelocytes 0 %   Promyelocytes Absolute 0 %   Blasts 0 %   nRBC 0 0 /100 WBC   Other 0 %   Neutro Abs 12.6 (H) 1.7 - 7.7 K/uL   Lymphs Abs 0.7 0.7 - 4.0 K/uL   Monocytes Absolute 0.4 0.1 - 1.0 K/uL   Eosinophils Absolute 0.0 0.0 - 0.7 K/uL   Basophils Absolute 0.0 0.0 - 0.1 K/uL   WBC Morphology INCREASED BANDS (>20% BANDS)     Comment: Performed at [facility], [place] [address], [place], [place] [address]   Urinalysis, Routine w reflex microscopic     Status: Abnormal   Collection Time: 06/22/17  9:04 PM  Result Value Ref Range   Color, Urine YELLOW YELLOW   APPearance HAZY (A) CLEAR   Specific Gravity, Urine 1.016 1.005 - 1.030   pH 5.0 5.0 - 8.0   Glucose, UA 50 (A) NEGATIVE mg/dL   Hgb urine dipstick NEGATIVE NEGATIVE   Bilirubin Urine NEGATIVE NEGATIVE   Ketones, ur 5 (A) NEGATIVE mg/dL   Protein, ur 30 (A) NEGATIVE mg/dL   Nitrite NEGATIVE NEGATIVE   Leukocytes, UA NEGATIVE NEGATIVE   RBC / HPF 0-5 0 - 5 RBC/hpf   WBC, UA 0-5 0 - 5 WBC/hpf   Bacteria, UA RARE (A) NONE SEEN   Squamous Epithelial / LPF 6-30 (A) NONE SEEN   Mucus PRESENT     Comment: Performed at [facility], [place] [address], [place], [place] [address]  Influenza panel by PCR (type A & B)     Status: Abnormal   Collection Time: 06/22/17  9:07 PM  Result Value Ref Range   Influenza A By PCR POSITIVE (A) NEGATIVE   Influenza B By PCR NEGATIVE NEGATIVE    Comment: (NOTE) The Xpert Xpress Flu assay is intended as an aid in the diagnosis of  influenza and should not be used as a sole basis for treatment.  This  assay is FDA approved for nasopharyngeal swab specimens only. Nasal  washings and aspirates are unacceptable for Xpert Xpress Flu testing. Performed at [facility], [place] [address], [place], [place] [address]   I-stat troponin, ED     Status: None   Collection Time: 06/22/17  9:34 PM  Result Value Ref Range   Troponin i, poc 0.03 0.00 - 0.08 ng/mL   Comment 3            Comment: Due to the release kinetics of cTnI, a negative result within the first hours of the onset of symptoms does not rule out myocardial infarction with certainty. If myocardial infarction is still suspected, repeat the test at appropriate intervals.   I-Stat CG4 Lactic Acid, ED     Status: None   Collection Time: 06/22/17  9:35 PM  Result Value Ref Range   Lactic Acid, Venous 1.63 0.5 - 1.9  mmol/L  Blood gas, arterial     Status: Abnormal   Collection Time: 06/23/17  2:53 AM  Result Value Ref Range   FIO2 40.00    Delivery systems NO CHARGE    pH, Arterial 7.320 (L) 7.350 - 7.450   pCO2 arterial 41.7 32.0 - 48.0 mmHg   pO2, Arterial 53.2 (L) 83.0 - 108.0 mmHg   Bicarbonate 20.8 20.0 - 28.0 mmol/L   Acid-base deficit 4.4 (H) 0.0 - 2.0 mmol/L   O2 Saturation 83.6 %   Patient temperature 98.6    Collection site LEFT RADIAL    Drawn by [id number]    Sample type ARTERIAL DRAW    Allens test (pass/fail) PASS PASS    Comment: Performed at [facility], [place] [address], [place], [place] [address]  Brain natriuretic peptide     Status: Abnormal   Collection Time: 06/23/17  8:47 AM  Result Value Ref Range   B Natriuretic Peptide 593.1 (H) 0.0 - 100.0 pg/mL    Comment: Performed at [facility], [place] [address], [place], [place] [address]  Culture, blood (routine x 2) Call MD if unable to obtain prior to antibiotics being given     Status: None (Preliminary result)   Collection Time: 06/23/17  8:48 AM  Result Value Ref Range   Specimen Description BLOOD RIGHT ANTECUBITAL    Special Requests      BOTTLES DRAWN AEROBIC AND ANAEROBIC Blood Culture adequate volume Performed at [place] [address] [place], [place] [address]    Culture PENDING    Report Status PENDING   Creatinine, serum     Status: None   Collection Time: 06/23/17  8:48 AM  Result Value Ref Range   Creatinine, Ser 0.61 0.44 - 1.00 mg/dL   GFR calc non Af Amer >60 >60 mL/min   GFR calc Af Amer >60 >60 mL/min    Comment: (NOTE) The eGFR has been calculated using the CKD EPI equation. This calculation has not been validated in all clinical situations. eGFR's persistently <60 mL/min signify possible Chronic Kidney Disease. Performed at [facility], [place] [address], [place], [place] [address]   Strep pneumoniae urinary antigen      Status: None   Collection Time: 06/23/17  8:49 AM  Result Value Ref Range   Strep Pneumo Urinary Antigen NEGATIVE NEGATIVE    Comment:        Infection due to S. pneumoniae cannot be absolutely ruled out since the antigen present may be below the detection limit of the test.   Comprehensive metabolic panel     Status: Abnormal   Collection Time: 06/23/17  8:49 AM  Result Value Ref Range   Sodium 124 (L) 135 - 145 mmol/L   Potassium 3.5 3.5 - 5.1 mmol/L    Comment: DELTA CHECK NOTED REPEATED TO VERIFY    Chloride 90 (L) 101 - 111 mmol/L   CO2 21 (L) 22 - 32 mmol/L   Glucose, Bld 190 (H) 65 - 99 mg/dL   BUN 10 6 - 20 mg/dL   Creatinine, Ser 0.58 0.44 - 1.00 mg/dL   Calcium 8.4 (L) 8.9 - 10.3 mg/dL   Total Protein 6.6 6.5 - 8.1 g/dL   Albumin 3.3 (L) 3.5 - 5.0 g/dL   AST 80 (H) 15 - 41 U/L   ALT 39 14 - 54 U/L  Alkaline Phosphatase 83 38 - 126 U/L   Total Bilirubin 0.5 0.3 - 1.2 mg/dL   GFR calc non Af Amer >60 >60 mL/min   GFR calc Af Amer >60 >60 mL/min    Comment: (NOTE) The eGFR has been calculated using the CKD EPI equation. This calculation has not been validated in all clinical situations. eGFR's persistently <60 mL/min signify possible Chronic Kidney Disease.    Anion gap 13 5 - 15    Comment: Performed at [facility], [place] [address], [place], [place] [address]  CBC WITH DIFFERENTIAL     Status: Abnormal   Collection Time: 06/23/17  8:49 AM  Result Value Ref Range   WBC 8.7 4.0 - 10.5 K/uL   RBC 3.21 (L) 3.87 - 5.11 MIL/uL   Hemoglobin 10.1 (L) 12.0 - 15.0 g/dL   HCT 28.6 (L) 36.0 - 46.0 %   MCV 89.1 78.0 - 100.0 fL   MCH 31.5 26.0 - 34.0 pg   MCHC 35.3 30.0 - 36.0 g/dL   RDW 14.0 11.5 - 15.5 %   Platelets 168 150 - 400 K/uL   Neutrophils Relative % 92 %   Lymphocytes Relative 4 %   Monocytes Relative 3 %   Eosinophils Relative 1 %   Basophils Relative 0 %   Neutro Abs 8.0 (H) 1.7 - 7.7 K/uL   Lymphs Abs 0.3 (L) 0.7 - 4.0 K/uL    Monocytes Absolute 0.3 0.1 - 1.0 K/uL   Eosinophils Absolute 0.1 0.0 - 0.7 K/uL   Basophils Absolute 0.0 0.0 - 0.1 K/uL   WBC Morphology INCREASED BANDS (>20% BANDS)     Comment: MILD LEFT SHIFT (1-5% METAS, OCC MYELO, OCC BANDS) TOXIC GRANULATION DOHLE BODIES VACUOLATED NEUTROPHILS Performed at [place] [address], [place], [place] [address]   Magnesium     Status: Abnormal   Collection Time: 06/23/17  8:49 AM  Result Value Ref Range   Magnesium 1.4 (L) 1.7 - 2.4 mg/dL    Comment: Performed at [facility], [place] [address] [place], [place] [address]  Brain natriuretic peptide     Status: Abnormal   Collection Time: 06/23/17 10:32 AM  Result Value Ref Range   B Natriuretic Peptide 772.4 (H) 0.0 - 100.0 pg/mL    Comment: Performed at [facility], [place] [address] [place], [place] [address]   Dg Chest 2 View  Result Date: 06/22/2017 CLINICAL DATA:  Cough and congestion EXAM: CHEST  2 VIEW COMPARISON:  December 17, 2007 FINDINGS: There is patchy airspace opacity in both mid and lower lung zones. Heart is upper normal in size with pulmonary vascularity within normal limits. No adenopathy. There is aortic atherosclerosis. No evident bone lesions. IMPRESSION: Patchy infiltrate in both mid and lower lung zones consistent with multifocal pneumonia. Heart upper normal in size. No evident adenopathy. There is aortic atherosclerosis. Aortic Atherosclerosis (ICD10-I70.0). Followup PA and lateral chest radiographs recommended in 3-4 weeks following trial of antibiotic therapy to ensure resolution and exclude underlying malignancy. Electronically Signed   By: Lowella Grip III M.D.   On: 06/22/2017 21:26    Pending Labs Unresulted Labs (From admission, onward)   Start     Ordered   06/30/17 0500  Creatinine, serum  (enoxaparin (LOVENOX)    CrCl >/= 30 ml/min)  Weekly,   R    Comments:  while on enoxaparin therapy    06/23/17 0728    06/30/17 0500  Creatinine, serum  (  enoxaparin (LOVENOX)    CrCl >/= 30 ml/min)  Weekly,   R    Comments:  while on enoxaparin therapy    06/23/17 0728   06/23/17 0814  Comprehensive metabolic panel  Daily,   R     06/23/17 0813   06/23/17 0814  CBC  Daily,   R     06/23/17 0813   06/23/17 0814  Magnesium  Daily,   R     06/23/17 0813   06/23/17 0814  Legionella Pneumophila Serogp 1 Ur Ag  Once,   R     06/23/17 0813   06/23/17 0728  HIV antibody  Once,   R     06/23/17 0728   06/23/17 0728  Culture, blood (routine x 2) Call MD if unable to obtain prior to antibiotics being given  BLOOD CULTURE X 2,   R    Comments:  If blood cultures drawn in Emergency Department - Do not draw and cancel order    06/23/17 0728   06/23/17 0728  Culture, sputum-assessment  Once,   R     06/23/17 0728   06/23/17 0728  Gram stain  Once,   R     06/23/17 0728   06/23/17 0728  CBC  Tomorrow morning,   R     06/23/17 0728   06/22/17 2129  Culture, blood (routine x 2)  BLOOD CULTURE X 2,   STAT     06/22/17 2128      Vitals/Pain Today's Vitals   06/23/17 1543 06/23/17 1630 06/23/17 1643 06/23/17 1730  BP:  131/76 131/76 130/70  Pulse:  98 95 87  Resp:  (!) 22 20 (!) 22  Temp:      TempSrc:      SpO2: 98% 96% 97% 96%  Weight:      Height:      PainSc:        Isolation Precautions Droplet precaution  Medications Medications  levothyroxine (SYNTHROID, LEVOTHROID) tablet 75 mcg (75 mcg Oral Given 06/23/17 1256)  amLODipine (NORVASC) tablet 5 mg (not administered)  multivitamin with minerals tablet (not administered)  enoxaparin (LOVENOX) injection 40 mg (not administered)  ipratropium-albuterol (DUONEB) 0.5-2.5 (3) MG/3ML nebulizer solution 3 mL (3 mLs Nebulization Given 06/23/17 1326)  oseltamivir (TAMIFLU) capsule 30 mg (30 mg Oral Given 06/23/17 1152)  azithromycin (ZITHROMAX) 500 mg in sodium chloride 0.9 % 250 mL IVPB (not administered)  cefTRIAXone (ROCEPHIN) 1 g in sodium chloride 0.9 %  100 mL IVPB (not administered)  methylPREDNISolone sodium succinate (SOLU-MEDROL) 40 mg/mL injection 40 mg (40 mg Intravenous Given 06/23/17 1151)  ipratropium-albuterol (DUONEB) 0.5-2.5 (3) MG/3ML nebulizer solution 3 mL (3 mLs Nebulization Given 06/23/17 1541)  potassium chloride SA (K-DUR,KLOR-CON) CR tablet 40 mEq (not administered)  magnesium oxide (MAG-OX) tablet 800 mg (not administered)  cefTRIAXone (ROCEPHIN) 1 g in dextrose 5 % 50 mL IVPB (0 g Intravenous Stopped 06/23/17 0047)  azithromycin (ZITHROMAX) 500 mg in dextrose 5 % 250 mL IVPB (0 mg Intravenous Stopped 06/22/17 2306)  oseltamivir (TAMIFLU) capsule 75 mg (75 mg Oral Given 06/23/17 0017)  methylPREDNISolone sodium succinate (SOLU-MEDROL) 125 mg/2 mL injection 60 mg (60 mg Intravenous Given 06/23/17 0140)  furosemide (LASIX) injection 80 mg (80 mg Intravenous Given 06/23/17 1326)    Mobility {Mobility:[id number]

## 2017-06-23 NOTE — ED Notes (Signed)
Paged Bount, MD about pt condition. Meds ordered

## 2017-06-23 NOTE — ED Notes (Signed)
amion paged Dr Bruna PotterBlount about pts increasing respiratory rate and lower sats.

## 2017-06-23 NOTE — ED Notes (Signed)
Pt needed to use the bedpan. When pt was moving her sats dropped to 72% and her work of breathing increased substantially. Pt was given breaks and her o2 eventually rose but only with coaching. MD notified

## 2017-06-23 NOTE — ED Notes (Signed)
Pt WOB has decreased and she is able to rest. Pt no longer using accessory muscles and RR have decreased also.

## 2017-06-23 NOTE — ED Notes (Signed)
PT DID NOT RECEIVE LUNCH TRAY. ADDITIONAL MEAL ORDER

## 2017-06-23 NOTE — H&P (Signed)
History and Physical    Connie Becker:[id number] DOB: July 12, 1935 DOA: 06/22/2017  Referring MD/NP/PA: Lyndel Safe, PA  PCP: Sigmund Hazel, MD   Outpatient Specialists: None   Patient coming from: Home  Chief Complaint: Shortness of breath and abdominal pain  HPI: Connie Becker is a 82 y.o. female with medical history significant of hypertension presenting with shortness of breath cough and overall fever and sick nares. She started having nausea vomiting diarrhea on Friday and then yesterday she started getting worse. She has overall fatigue or malaise. High fever with cough. In the ER patient was found to be hypoxic on room air with oxygen sats between 85 and 88%. She had no prior lung disease. Not a smoker. Patient denied any previous sick contact. She has not had any flu shot this CL.   ED Course: Patient was evaluated and was found to be influenza A positive. Chest x-ray also showed bilateral lower lobe infiltrates, she has leukocytosis with a white count of 13.7 and his sodium 125. She appears dehydrated overall  Review of Sy stems: As per HPI otherwise 10 point review of systems negative.   History reviewed. No pertinent past medical history.  History reviewed. No pertinent surgical history.   has no tobacco, alcohol, and drug history on file.  No Known Allergies  History reviewed. No pertinent family history.   Prior to Admission medications   Medication Sig Start Date End Date Taking? Authorizing Provider  amLODipine (NORVASC) 5 MG tablet Take 5 mg by mouth every evening. 05/26/17  Yes [provider]  levothyroxine (SYNTHROID, LEVOTHROID) 75 MCG tablet Take 75 mcg by mouth daily after breakfast. 06/19/17  Yes [provider]  Multiple Vitamins-Minerals (MULTIVITAMIN ADULT PO) Take 1 tablet by mouth every evening.   Yes [provider]    Physical Exam: Vitals:   06/22/17 2038 06/22/17 2152 06/22/17 2230 06/22/17 2326  BP: (!) 173/76  (!) 194/86 (!) 147/90 136/66  Pulse: 94 91 81 82  Resp: (!) 26 20 (!) 23 (!) 23  Temp: 98.9 F (37.2 C)     TempSrc: Oral     SpO2: (!) 85% 98% 97% 96%  Weight:  52.2 kg (115 lb)    Height:  5\' 1"  (1.549 m)        Constitutional: NAD, calm, comfortable Vitals:   06/22/17 2038 06/22/17 2152 06/22/17 2230 06/22/17 2326  BP: (!) 173/76 (!) 194/86 (!) 147/90 136/66  Pulse: 94 91 81 82  Resp: (!) 26 20 (!) 23 (!) 23  Temp: 98.9 F (37.2 C)     TempSrc: Oral     SpO2: (!) 85% 98% 97% 96%  Weight:  52.2 kg (115 lb)    Height:  5\' 1"  (1.549 m)     Eyes: PERRL, lids and conjunctivae normal ENMT: Mucous membranes are moist. Posterior pharynx clear of any exudate or lesions.Normal dentition.  Neck: normal, supple, no masses, no thyromegaly Respiratory: Bilateral basal crackles with some mild extremity wheezing Normal respiratory effort. No accessory muscle use.  Cardiovascular: Regular rate and rhythm, no murmurs / rubs / gallops. No extremity edema. 2+ pedal pulses. No carotid bruits.  Abdomen: no tenderness, no masses palpated. No hepatosplenomegaly. Bowel sounds positive.  Musculoskeletal: no clubbing / cyanosis. No joint deformity upper and lower extremities. Good ROM, no contractures. Normal muscle tone.  Skin: no rashes, lesions, ulcers. No induration Neurologic: CN 2-12 grossly intact. Sensation intact, DTR normal. Strength 5/5 in all 4.  Psychiatric: Normal  judgment and insight. Alert and oriented x 3. Normal mood.    Labs on Admission: I have personally reviewed following labs and imaging studies  CBC: Recent Labs  Lab 06/22/17 2104  WBC 13.7*  NEUTROABS 12.6*  HGB 10.9*  HCT 31.2*  MCV 90.2  PLT 176   Basic Metabolic Panel: Recent Labs  Lab 06/22/17 2104  NA 125*  K 4.7  CL 91*  CO2 26  GLUCOSE 146*  BUN 13  CREATININE 0.68  CALCIUM 9.1   GFR: Estimated Creatinine Clearance: 43 mL/min (by C-G formula based on SCr of 0.68 mg/dL). Liver Function  Tests: Recent Labs  Lab 06/22/17 2104  AST 59*  ALT 32  ALKPHOS 93  BILITOT 0.5  PROT 7.5  ALBUMIN 3.8   No results for input(s): LIPASE, AMYLASE in the last 168 hours. No results for input(s): AMMONIA in the last 168 hours. Coagulation Profile: No results for input(s): INR, PROTIME in the last 168 hours. Cardiac Enzymes: No results for input(s): CKTOTAL, CKMB, CKMBINDEX, TROPONINI in the last 168 hours. BNP (last 3 results) No results for input(s): PROBNP in the last 8760 hours. HbA1C: No results for input(s): HGBA1C in the last 72 hours. CBG: No results for input(s): GLUCAP in the last 168 hours. Lipid Profile: No results for input(s): CHOL, HDL, LDLCALC, TRIG, CHOLHDL, LDLDIRECT in the last 72 hours. Thyroid Function Tests: No results for input(s): TSH, T4TOTAL, FREET4, T3FREE, THYROIDAB in the last 72 hours. Anemia Panel: No results for input(s): VITAMINB12, FOLATE, FERRITIN, TIBC, IRON, RETICCTPCT in the last 72 hours. Urine analysis:    Component Value Date/Time   COLORURINE YELLOW 06/22/2017 2104   APPEARANCEUR HAZY (A) 06/22/2017 2104   LABSPEC 1.016 06/22/2017 2104   PHURINE 5.0 06/22/2017 2104   GLUCOSEU 50 (A) 06/22/2017 2104   HGBUR NEGATIVE 06/22/2017 2104   BILIRUBINUR NEGATIVE 06/22/2017 2104   KETONESUR 5 (A) 06/22/2017 2104   PROTEINUR 30 (A) 06/22/2017 2104   UROBILINOGEN 0.2 06/15/2007 0847   NITRITE NEGATIVE 06/22/2017 2104   LEUKOCYTESUR NEGATIVE 06/22/2017 2104   Sepsis Labs: @LABRCNTIP (procalcitonin:4,lacticidven:4) )No results found for this or any previous visit (from the past 240 hour(s)).   Radiological Exams on Admission: Dg Chest 2 View  Result Date: 06/22/2017 CLINICAL DATA:  Cough and congestion EXAM: CHEST  2 VIEW COMPARISON:  December 17, 2007 FINDINGS: There is patchy airspace opacity in both mid and lower lung zones. Heart is upper normal in size with pulmonary vascularity within normal limits. No adenopathy. There is aortic  atherosclerosis. No evident bone lesions. IMPRESSION: Patchy infiltrate in both mid and lower lung zones consistent with multifocal pneumonia. Heart upper normal in size. No evident adenopathy. There is aortic atherosclerosis. Aortic Atherosclerosis (ICD10-I70.0). Followup PA and lateral chest radiographs recommended in 3-4 weeks following trial of antibiotic therapy to ensure resolution and exclude underlying malignancy. Electronically Signed   By: Bretta Bang III M.D.   On: 06/22/2017 21:26    EKG: Independently reviewed.   Assessment/Plan Principal Problem:   Community acquired pneumonia Active Problems:   Influenza A   Hyponatremia   Leucocytosis   Pneumonia    #1 bilateral lower lobe pneumonia: Most likely community-acquired pneumonia secondary to Haemophilus influenza. Patient is likely having post influenza pneumonia at this point. We will initiate IV Rocephin and Zithromax while keeping patient on oxygen due to hypoxemia. Blood cultures and sputum cultures will be obtained and antibiotics will be adjusted accordingly.  #2 influenza A+: Patient will be initiated on Tamiflu.  Droplet precaution will be initiated. Supportive care with hydration will be given.  #3 hyponatremia: Most likely due to dehydration from her flu. Continue hydration with normal saline.  #4 leukocytosis: Secondary to pneumonia and influenza. Hydrate patient and follow closely.  #5 hypertension: Patient is on antihypertensive at home. Resume her home regimen.  #6 GERD: Continue PPIs   DVT prophylaxis: Lovenox   Code Status: Full   Family Communication: Patient's husband called [place] who is in the room with patient  Disposition Plan: Home   Consults called: None  Admission status: Inpatient   Severity of Illness: The appropriate patient status for this patient is INPATIENT. Inpatient status is judged to be reasonable and necessary in order to provide the required intensity of service to  ensure the patient's safety. The patient's presenting symptoms, physical exam findings, and initial radiographic and laboratory data in the context of their chronic comorbidities is felt to place them at high risk for further clinical deterioration. Furthermore, it is not anticipated that the patient will be medically stable for discharge from the hospital within 2 midnights of admission. The following factors support the patient status of inpatient.   " The patient's presenting symptoms include hypoxemia with cough and fever. " The worrisome physical exam findings include hypoxia. " The initial radiographic and laboratory data are worrisome because of bilateral infiltrates in the lung. " The chronic co-morbidities include hypertension.   * I certify that at the point of admission it is my clinical judgment that the patient will require inpatient hospital care spanning beyond 2 midnights from the point of admission due to high intensity of service, high risk for further deterioration and high frequency of surveillance required.Lonia Blood*    Iowa Kappes,LAWAL MD [facility] Pager [phone]  If 7PM-7AM, please contact night-coverage [email or web address] Password [email or web address]  06/23/2017, 12:09 AM

## 2017-06-23 NOTE — ED Notes (Signed)
amion paged Dr Bruna PotterBlount about changing ABG from istat to regular ABG.

## 2017-06-23 NOTE — Progress Notes (Signed)
Pt gave permission for family to remain in room while nursing admission history was done. Connie Becker. Carleane Nate Common BSN, RN-BC Admissions RN 06/23/2017 4:25 PM

## 2017-06-23 NOTE — Progress Notes (Signed)
PROGRESS NOTE    Connie Becker  ZOX:[id number] DOB: 08-14-1935 DOA: 06/22/2017 PCP: Sigmund Hazel, MD   Brief Narrative:  82 year old female with a history of essential hypertension, tobacco use came to the hospital with complains of cough, fevers and chills.  She states she started with cough about a week ago but this worsened over the weekend.  In the ER she was noted to be febrile and hypoxic with 85% on room air.  Chest x-ray showed bilateral lower lobe infiltrate concerning for pneumonia.  She was also influenza positive.  She was started on Rocephin, ceftriaxone and Tamiflu.   Assessment & Plan:   Principal Problem:   Community acquired pneumonia Active Problems:   Influenza A   Hyponatremia   Leucocytosis   Pneumonia   SOB (shortness of breath)  Bilateral lower lobe community-acquired pneumonia Influenza a positive Acute respiratory distress with hypoxia, 80% on room air - Currently patient is on Ventimask given her hypoxia - She has been started on azithromycin and Rocephin which will continue -Continue Tamiflu -Nebulizer treatments, Solu-Medrol IV -Supplemental oxygen as needed, will wean off as deemed appropriate -Due to elevated BNP I will discontinue her fluids, give one-time of IV Lasix.  Active tobacco use -Counseled on tobacco cessation -Nicotine patch if necessary  Hypothyroidism -Continue Synthroid 75 mcg  Essential hypertension -Continue 5 mg orally   DVT prophylaxis: Lovenox Code Status: Full code Family Communication: Daughter and sister at bedside Disposition Plan: Inpatient admission to stepdown unit as patient is on Ventimask.  Consultants:   None  Antimicrobials:   Azithromycin 2/11  Rocephin 2/11   Subjective: Patient states her breathing is better since receiving some treatment in the ER and upon admission.  She still gets very short of breath upon minimal exertion and movement in the bed.  She remains on Ventimask. Respiratory  therapy attempted to take her Ventimask and transition to nasal cannula but she desatted to 80% with prolonged recovery.  Objective: Vitals:   06/23/17 1149 06/23/17 1151 06/23/17 1238 06/23/17 1255  BP: (!) 145/84  (!) 173/158 (!) 159/86  Pulse: 87 84 85 85  Resp: (!) 27 (!) 24 (!) 22 (!) 27  Temp:      TempSrc:      SpO2: 94% 95% 95% 93%  Weight:      Height:        Intake/Output Summary (Last 24 hours) at 06/23/2017 1259 Last data filed at 06/23/2017 1207 Gross per 24 hour  Intake 600 ml  Output 100 ml  Net 500 ml   Filed Weights   06/22/17 2152  Weight: 52.2 kg (115 lb)    Examination:  General exam: Appears calm and comfortable, Ventimask in place Respiratory system: Diffuse coarse breath sounds Cardiovascular system: S1 & S2 heard, RRR. No JVD, murmurs, rubs, gallops or clicks. No pedal edema. Gastrointestinal system: Abdomen is nondistended, soft and nontender. No organomegaly or masses felt. Normal bowel sounds heard. Central nervous system: Alert and oriented. No focal neurological deficits. Extremities: Symmetric 5 x 5 power. Skin: No rashes, lesions or ulcers Psychiatry: Judgement and insight appear normal. Mood & affect appropriate.     Data Reviewed:   CBC: Recent Labs  Lab 06/22/17 2104 06/23/17 0849  WBC 13.7* 8.7  NEUTROABS 12.6* 8.0*  HGB 10.9* 10.1*  HCT 31.2* 28.6*  MCV 90.2 89.1  PLT 176 168   Basic Metabolic Panel: Recent Labs  Lab 06/22/17 2104 06/23/17 0848 06/23/17 0849  NA 125*  --  124*  K 4.7  --  3.5  CL 91*  --  90*  CO2 26  --  21*  GLUCOSE 146*  --  190*  BUN 13  --  10  CREATININE 0.68 0.61 0.58  CALCIUM 9.1  --  8.4*  MG  --   --  1.4*   GFR: Estimated Creatinine Clearance: 43 mL/min (by C-G formula based on SCr of 0.58 mg/dL). Liver Function Tests: Recent Labs  Lab 06/22/17 2104 06/23/17 0849  AST 59* 80*  ALT 32 39  ALKPHOS 93 83  BILITOT 0.5 0.5  PROT 7.5 6.6  ALBUMIN 3.8 3.3*   No results for  input(s): LIPASE, AMYLASE in the last 168 hours. No results for input(s): AMMONIA in the last 168 hours. Coagulation Profile: No results for input(s): INR, PROTIME in the last 168 hours. Cardiac Enzymes: No results for input(s): CKTOTAL, CKMB, CKMBINDEX, TROPONINI in the last 168 hours. BNP (last 3 results) No results for input(s): PROBNP in the last 8760 hours. HbA1C: No results for input(s): HGBA1C in the last 72 hours. CBG: No results for input(s): GLUCAP in the last 168 hours. Lipid Profile: No results for input(s): CHOL, HDL, LDLCALC, TRIG, CHOLHDL, LDLDIRECT in the last 72 hours. Thyroid Function Tests: No results for input(s): TSH, T4TOTAL, FREET4, T3FREE, THYROIDAB in the last 72 hours. Anemia Panel: No results for input(s): VITAMINB12, FOLATE, FERRITIN, TIBC, IRON, RETICCTPCT in the last 72 hours. Sepsis Labs: Recent Labs  Lab 06/22/17 2135  LATICACIDVEN 1.63    Recent Results (from the past 240 hour(s))  Culture, blood (routine x 2) Call MD if unable to obtain prior to antibiotics being given     Status: None (Preliminary result)   Collection Time: 06/23/17  8:48 AM  Result Value Ref Range Status   Specimen Description BLOOD RIGHT ANTECUBITAL  Final   Special Requests   Final    BOTTLES DRAWN AEROBIC AND ANAEROBIC Blood Culture adequate volume Performed at [facility], [address], [place], [place] [address]    Culture PENDING  Incomplete   Report Status PENDING  Incomplete         Radiology Studies: Dg Chest 2 View  Result Date: 06/22/2017 CLINICAL DATA:  Cough and congestion EXAM: CHEST  2 VIEW COMPARISON:  December 17, 2007 FINDINGS: There is patchy airspace opacity in both mid and lower lung zones. Heart is upper normal in size with pulmonary vascularity within normal limits. No adenopathy. There is aortic atherosclerosis. No evident bone lesions. IMPRESSION: Patchy infiltrate in both mid and lower lung zones consistent with multifocal  pneumonia. Heart upper normal in size. No evident adenopathy. There is aortic atherosclerosis. Aortic Atherosclerosis (ICD10-I70.0). Followup PA and lateral chest radiographs recommended in 3-4 weeks following trial of antibiotic therapy to ensure resolution and exclude underlying malignancy. Electronically Signed   By: Bretta Bang III M.D.   On: 06/22/2017 21:26        Scheduled Meds: . amLODipine  5 mg Oral QPM  . enoxaparin (LOVENOX) injection  40 mg Subcutaneous Q24H  . ipratropium-albuterol  3 mL Nebulization Q6H  . levothyroxine  75 mcg Oral QPC breakfast  . methylPREDNISolone (SOLU-MEDROL) injection  40 mg Intravenous Q8H  . multivitamin with minerals   Oral QPM  . oseltamivir  30 mg Oral BID   Continuous Infusions: . sodium chloride 75 mL/hr at 06/23/17 1104  . sodium chloride 100 mL/hr at 06/23/17 0332  . azithromycin    . [START ON 06/24/2017] cefTRIAXone (ROCEPHIN)  IV       LOS: 0 days    Time spent: 35 mins    Ankit Joline Maxcyhirag Amin, MD [facility] Pager [phone]   If 7PM-7AM, please contact night-coverage [email or web address] Password [email or web address] 06/23/2017, 1:00 PM

## 2017-06-23 NOTE — ED Notes (Signed)
BLOOD CULTURE X 1 COLLECTED AFTER ANTIBIOTICS STARTED. 5ML/EACH RT AC.

## 2017-06-23 NOTE — ED Notes (Signed)
PT IS NOT A DIABETIC.

## 2017-06-24 ENCOUNTER — Inpatient Hospital Stay (HOSPITAL_COMMUNITY): Payer: Medicare Other

## 2017-06-24 DIAGNOSIS — R0609 Other forms of dyspnea: Secondary | ICD-10-CM

## 2017-06-24 DIAGNOSIS — R0902 Hypoxemia: Secondary | ICD-10-CM

## 2017-06-24 LAB — MAGNESIUM: Magnesium: 1.6 mg/dL — ABNORMAL LOW (ref 1.7–2.4)

## 2017-06-24 LAB — COMPREHENSIVE METABOLIC PANEL
ALBUMIN: 3.2 g/dL — AB (ref 3.5–5.0)
ALT: 37 U/L (ref 14–54)
AST: 80 U/L — AB (ref 15–41)
Alkaline Phosphatase: 74 U/L (ref 38–126)
Anion gap: 13 (ref 5–15)
BUN: 12 mg/dL (ref 6–20)
CHLORIDE: 90 mmol/L — AB (ref 101–111)
CO2: 22 mmol/L (ref 22–32)
CREATININE: 0.71 mg/dL (ref 0.44–1.00)
Calcium: 8.6 mg/dL — ABNORMAL LOW (ref 8.9–10.3)
GFR calc Af Amer: >60 mL/min (ref >60)
GFR calc non Af Amer: >60 mL/min (ref >60)
Glucose, Bld: 124 mg/dL — ABNORMAL HIGH (ref 65–99)
Potassium: 3.9 mmol/L (ref 3.5–5.1)
Sodium: 125 mmol/L — ABNORMAL LOW (ref 135–145)
Total Bilirubin: 0.5 mg/dL (ref 0.3–1.2)
Total Protein: 6.6 g/dL (ref 6.5–8.1)

## 2017-06-24 LAB — CBC
HCT: 27.9 % — ABNORMAL LOW (ref 36.0–46.0)
Hemoglobin: 10 g/dL — ABNORMAL LOW (ref 12.0–15.0)
MCH: 31.2 pg (ref 26.0–34.0)
MCHC: 35.8 g/dL (ref 30.0–36.0)
MCV: 86.9 fL (ref 78.0–100.0)
PLATELETS: 190 10*3/uL (ref 150–400)
RBC: 3.21 MIL/uL — ABNORMAL LOW (ref 3.87–5.11)
RDW: 13.7 % (ref 11.5–15.5)
WBC: 17.2 10*3/uL — ABNORMAL HIGH (ref 4.0–10.5)

## 2017-06-24 LAB — HIV ANTIBODY (ROUTINE TESTING W REFLEX): HIV SCREEN 4TH GENERATION: NONREACTIVE

## 2017-06-24 LAB — LEGIONELLA PNEUMOPHILA SEROGP 1 UR AG: L. PNEUMOPHILA SEROGP 1 UR AG: NEGATIVE

## 2017-06-24 MED ORDER — IPRATROPIUM-ALBUTEROL 0.5-2.5 (3) MG/3ML IN SOLN
3.0000 mL | Freq: Three times a day (TID) | RESPIRATORY_TRACT | Status: DC
  Administered 2017-06-24 – 2017-06-27 (×9): 3 mL via RESPIRATORY_TRACT
  Filled 2017-06-24 (×8): qty 3

## 2017-06-24 MED ORDER — METHYLPREDNISOLONE SODIUM SUCC 40 MG IJ SOLR
40.0000 mg | Freq: Two times a day (BID) | INTRAMUSCULAR | Status: DC
  Administered 2017-06-24 – 2017-06-25 (×2): 40 mg via INTRAVENOUS
  Filled 2017-06-24 (×2): qty 1

## 2017-06-24 MED ORDER — IPRATROPIUM-ALBUTEROL 0.5-2.5 (3) MG/3ML IN SOLN
3.0000 mL | Freq: Four times a day (QID) | RESPIRATORY_TRACT | Status: DC
  Administered 2017-06-24: 3 mL via RESPIRATORY_TRACT
  Filled 2017-06-24: qty 3

## 2017-06-24 MED ORDER — GUAIFENESIN-DM 100-10 MG/5ML PO SYRP
5.0000 mL | ORAL_SOLUTION | ORAL | Status: DC | PRN
  Administered 2017-06-24 – 2017-06-27 (×5): 5 mL via ORAL
  Filled 2017-06-24 (×5): qty 10

## 2017-06-24 NOTE — Care Management Note (Signed)
Case Management Note  Patient Details  Name: Connie Becker MRN: [id number] Date of Birth: 03/11/1936  Subjective/Objective:                  Lower lobe pneumonia  Action/Plan:  Date: June 24, 2017 Connie Becker, BSN, [place]3, [place]CCM [phone] Chart and notes review for patient progress and needs. Will follow for case management and discharge needs. No cm or discharge needs present at time of this review. Next review date: [id number] Expected Discharge Date:  (unknown)               Expected Discharge Plan:  Home/Self Care  In-House Referral:     Discharge planning Services  CM Consult  Post Acute Care Choice:    Choice offered to:     DME Arranged:    DME Agency:     HH Arranged:    HH Agency:     Status of Service:  In process, will continue to follow  If discussed at Long Length of Stay Meetings, dates discussed:    Additional Comments:  Connie Becker, Connie Maddison Lynn, RN 06/24/2017, 8:54 AM

## 2017-06-24 NOTE — Progress Notes (Signed)
Report called to Rn [id number]. Patient with no complaints at the current time. Will transfer via WC.

## 2017-06-24 NOTE — Progress Notes (Addendum)
PROGRESS NOTE    Connie Becker  WUJ:[id number] DOB: January 12, 1936 DOA: 06/22/2017 PCP: Sigmund Hazel, MD   Brief Narrative:  82 year old female with a history of essential hypertension, tobacco use came to the hospital with complains of cough, fevers and chills.  She states she started with cough about a week ago but this worsened over the weekend.  In the ER she was noted to be febrile and hypoxic with 85% on room air.  Chest x-ray showed bilateral lower lobe infiltrate concerning for pneumonia.  She was also influenza positive.  She was started on Rocephin, ceftriaxone and Tamiflu.   Assessment & Plan:   Principal Problem:   Community acquired pneumonia Active Problems:   Influenza A   Hyponatremia   Leucocytosis   Pneumonia   SOB (shortness of breath)  Bilateral lower lobe community-acquired pneumonia Influenza a positive Acute respiratory distress with hypoxia, improved.  -Ventimask has been transitioned to nasal cannula.  She is doing well.  Will attempt to wean off nasal cannula today. - She has been started on azithromycin and Rocephin which will continue -Continue Tamiflu -Nebulizer treatments, Solu-Medrol IV will reduce the frequency to every 12 hours -Advised the nursing staff to walk around and check ambulatory pulse ox. -Increase in bibasilar infiltrates on the chest x-ray, incentive spirometry ordered.  Advised patient to use it intermittently 8-10 times per hour.  Active tobacco use -Counseled on tobacco cessation -Nicotine patch if necessary  Hypothyroidism -Continue Synthroid 75 mcg  Essential hypertension -Continue Norvasc 5 mg orally   DVT prophylaxis: Lovenox Code Status: Full code Family Communication: Daughter is at the bedside Disposition Plan: Transfer to MedSurg.  If continues to do well she can be discharged in next 24 hours  Consultants:   None  Antimicrobials:   Azithromycin 2/11  Rocephin 2/11   Subjective: States she feels a whole lot  better.  No acute events overnight, Ventimask was able to be weaned off to nasal cannula.  While I was in the room I reduced her nasal cannula 4 L to 2 L which she tolerated well.  Objective: Vitals:   06/24/17 0600 06/24/17 0800 06/24/17 0953 06/24/17 1000  BP: (!) 102/44 117/76  (!) 123/94  Pulse: (!) 49 69 88 87  Resp: 20 (!) 27 18 16   Temp:  98.2 F (36.8 C)    TempSrc:  Oral    SpO2: 98% 96% 95% 97%  Weight:      Height:        Intake/Output Summary (Last 24 hours) at 06/24/2017 1152 Last data filed at 06/24/2017 0800 Gross per 24 hour  Intake 2210 ml  Output 2000 ml  Net 210 ml   Filed Weights   06/22/17 2152 06/23/17 1919 06/24/17 0354  Weight: 52.2 kg (115 lb) 55 kg (121 lb 4.1 oz) 55.4 kg (122 lb 2.2 oz)    Examination:  General exam: Appears much more calm and comfortable this morning on nasal cannula. Respiratory system: Very mild expiratory coarse breath sounds but greatly improved from yesterday Cardiovascular system: S1 & S2 heard, RRR. No JVD, murmurs, rubs, gallops or clicks. No pedal edema. Gastrointestinal system: Abdomen is nondistended, soft and nontender. No organomegaly or masses felt. Normal bowel sounds heard. Central nervous system: Alert and oriented. No focal neurological deficits. Extremities: Symmetric 5 x 5 power. Skin: No rashes, lesions or ulcers Psychiatry: Judgement and insight appear normal. Mood & affect appropriate.     Data Reviewed:   CBC: Recent Labs  Lab 06/22/17  2104 06/23/17 0849 06/24/17 0334  WBC 13.7* 8.7 17.2*  NEUTROABS 12.6* 8.0*  --   HGB 10.9* 10.1* 10.0*  HCT 31.2* 28.6* 27.9*  MCV 90.2 89.1 86.9  PLT 176 168 190   Basic Metabolic Panel: Recent Labs  Lab 06/22/17 2104 06/23/17 0848 06/23/17 0849 06/24/17 0334  NA 125*  --  124* 125*  K 4.7  --  3.5 3.9  CL 91*  --  90* 90*  CO2 26  --  21* 22  GLUCOSE 146*  --  190* 124*  BUN 13  --  10 12  CREATININE 0.68 0.61 0.58 0.71  CALCIUM 9.1  --  8.4* 8.6*   MG  --   --  1.4* 1.6*   GFR: Estimated Creatinine Clearance: 43.3 mL/min (by C-G formula based on SCr of 0.71 mg/dL). Liver Function Tests: Recent Labs  Lab 06/22/17 2104 06/23/17 0849 06/24/17 0334  AST 59* 80* 80*  ALT 32 39 37  ALKPHOS 93 83 74  BILITOT 0.5 0.5 0.5  PROT 7.5 6.6 6.6  ALBUMIN 3.8 3.3* 3.2*   No results for input(s): LIPASE, AMYLASE in the last 168 hours. No results for input(s): AMMONIA in the last 168 hours. Coagulation Profile: No results for input(s): INR, PROTIME in the last 168 hours. Cardiac Enzymes: No results for input(s): CKTOTAL, CKMB, CKMBINDEX, TROPONINI in the last 168 hours. BNP (last 3 results) No results for input(s): PROBNP in the last 8760 hours. HbA1C: No results for input(s): HGBA1C in the last 72 hours. CBG: No results for input(s): GLUCAP in the last 168 hours. Lipid Profile: No results for input(s): CHOL, HDL, LDLCALC, TRIG, CHOLHDL, LDLDIRECT in the last 72 hours. Thyroid Function Tests: No results for input(s): TSH, T4TOTAL, FREET4, T3FREE, THYROIDAB in the last 72 hours. Anemia Panel: No results for input(s): VITAMINB12, FOLATE, FERRITIN, TIBC, IRON, RETICCTPCT in the last 72 hours. Sepsis Labs: Recent Labs  Lab 06/22/17 2135  LATICACIDVEN 1.63    Recent Results (from the past 240 hour(s))  Culture, blood (routine x 2)     Status: None (Preliminary result)   Collection Time: 06/22/17  9:29 PM  Result Value Ref Range Status   Specimen Description   Final    BLOOD RIGHT WRIST Performed at [facility], [address], [place], [place] [address]    Special Requests   Final    BOTTLES DRAWN AEROBIC AND ANAEROBIC Blood Culture adequate volume Performed at [facility], [address], [place], [place] [address]    Culture   Final    NO GROWTH 1 DAY Performed at [facility], [address], [place], [place] [address]    Report Status PENDING  Incomplete  Culture, blood (routine x 2)      Status: None (Preliminary result)   Collection Time: 06/22/17  9:34 PM  Result Value Ref Range Status   Specimen Description   Final    BLOOD RIGHT ANTECUBITAL Performed at [facility], [address] [place], [place] [address]    Special Requests   Final    BOTTLES DRAWN AEROBIC AND ANAEROBIC Blood Culture adequate volume Performed at [facility], [address], [place], [place] [address]    Culture   Final    NO GROWTH 1 DAY Performed at [facility], [address] [place], [place] [address]    Report Status PENDING  Incomplete  Culture, blood (routine x 2) Call MD if  unable to obtain prior to antibiotics being given     Status: None (Preliminary result)   Collection Time: 06/23/17  8:48 AM  Result Value Ref Range Status   Specimen Description BLOOD RIGHT ANTECUBITAL  Final   Special Requests   Final    BOTTLES DRAWN AEROBIC AND ANAEROBIC Blood Culture adequate volume Performed at [facility], [address] [place], [place] [address]    Culture   Final    NO GROWTH < 24 HOURS Performed at [facility], [address], [place], [place] [address]    Report Status PENDING  Incomplete  Culture, blood (routine x 2) Call MD if unable to obtain prior to antibiotics being given     Status: None (Preliminary result)   Collection Time: 06/23/17  8:49 AM  Result Value Ref Range Status   Specimen Description   Final    BLOOD RIGHT FOREARM Performed at [facility], [address] [place], [place] [address]    Special Requests   Final    BOTTLES DRAWN AEROBIC AND ANAEROBIC Blood Culture adequate volume Performed at [facility], [address] [place], [place] [address]    Culture   Final    NO GROWTH < 24 HOURS Performed at [facility], [address], [place], [place] [address]    Report Status PENDING  Incomplete  MRSA PCR Screening     Status: None    Collection Time: 06/23/17  7:28 PM  Result Value Ref Range Status   MRSA by PCR NEGATIVE NEGATIVE Final    Comment:        The GeneXpert MRSA Assay (FDA approved for NASAL specimens only), is one component of a comprehensive MRSA colonization surveillance program. It is not intended to diagnose MRSA infection nor to guide or monitor treatment for MRSA infections. Performed at [facility], [address] [place], [place] [address]          Radiology Studies: Dg Chest 2 View  Result Date: 06/22/2017 CLINICAL DATA:  Cough and congestion EXAM: CHEST  2 VIEW COMPARISON:  December 17, 2007 FINDINGS: There is patchy airspace opacity in both mid and lower lung zones. Heart is upper normal in size with pulmonary vascularity within normal limits. No adenopathy. There is aortic atherosclerosis. No evident bone lesions. IMPRESSION: Patchy infiltrate in both mid and lower lung zones consistent with multifocal pneumonia. Heart upper normal in size. No evident adenopathy. There is aortic atherosclerosis. Aortic Atherosclerosis (ICD10-I70.0). Followup PA and lateral chest radiographs recommended in 3-4 weeks following trial of antibiotic therapy to ensure resolution and exclude underlying malignancy. Electronically Signed   By: Bretta Bang III M.D.   On: 06/22/2017 21:26   Dg Chest Port 1 View  Result Date: 06/24/2017 CLINICAL DATA:  Dyspnea EXAM: PORTABLE CHEST 1 VIEW COMPARISON:  06/22/2017 FINDINGS: Cardiac shadow is stable. Bibasilar infiltrates are noted increased from the prior exam. No sizable effusion or pneumothorax is noted. No bony abnormality is seen. IMPRESSION: Increased bibasilar infiltrates. Electronically Signed   By: Alcide Clever M.D.   On: 06/24/2017 07:48        Scheduled Meds: . amLODipine  5 mg Oral QPM  . enoxaparin (LOVENOX) injection  40 mg Subcutaneous Q24H  . ipratropium-albuterol  3 mL Nebulization TID  . levothyroxine  75 mcg Oral QPC  breakfast  . methylPREDNISolone (SOLU-MEDROL) injection  40 mg Intravenous Q8H  . multivitamin with minerals   Oral  QPM  . oseltamivir  30 mg Oral BID   Continuous Infusions: . azithromycin Stopped (06/23/17 2201)  . cefTRIAXone (ROCEPHIN)  IV Stopped (06/24/17 0002)     LOS: 1 day    Time spent: 35 mins    Rozalynn Buege Joline Maxcyhirag Lona Six, MD [facility] Pager [phone]   If 7PM-7AM, please contact night-coverage [email or web address] Password [email or web address] 06/24/2017, 11:52 AM

## 2017-06-25 LAB — CBC
HCT: 31 % — ABNORMAL LOW (ref 36.0–46.0)
Hemoglobin: 11 g/dL — ABNORMAL LOW (ref 12.0–15.0)
MCH: 31.2 pg (ref 26.0–34.0)
MCHC: 35.5 g/dL (ref 30.0–36.0)
MCV: 87.8 fL (ref 78.0–100.0)
PLATELETS: 238 10*3/uL (ref 150–400)
RBC: 3.53 MIL/uL — ABNORMAL LOW (ref 3.87–5.11)
RDW: 14.1 % (ref 11.5–15.5)
WBC: 29.5 10*3/uL — ABNORMAL HIGH (ref 4.0–10.5)

## 2017-06-25 LAB — COMPREHENSIVE METABOLIC PANEL
ALK PHOS: 82 U/L (ref 38–126)
ALT: 39 U/L (ref 14–54)
AST: 65 U/L — AB (ref 15–41)
Albumin: 3.1 g/dL — ABNORMAL LOW (ref 3.5–5.0)
Anion gap: 15 (ref 5–15)
BUN: 14 mg/dL (ref 6–20)
CHLORIDE: 97 mmol/L — AB (ref 101–111)
CO2: 23 mmol/L (ref 22–32)
CREATININE: 0.65 mg/dL (ref 0.44–1.00)
Calcium: 9.2 mg/dL (ref 8.9–10.3)
GFR calc Af Amer: >60 mL/min (ref >60)
Glucose, Bld: 121 mg/dL — ABNORMAL HIGH (ref 65–99)
Potassium: 3.7 mmol/L (ref 3.5–5.1)
SODIUM: 135 mmol/L (ref 135–145)
Total Bilirubin: 0.6 mg/dL (ref 0.3–1.2)
Total Protein: 6.8 g/dL (ref 6.5–8.1)

## 2017-06-25 LAB — MAGNESIUM: Magnesium: 2.2 mg/dL (ref 1.7–2.4)

## 2017-06-25 MED ORDER — LIP MEDEX EX OINT
TOPICAL_OINTMENT | CUTANEOUS | Status: AC
  Administered 2017-06-25: 14:00:00
  Filled 2017-06-25: qty 7

## 2017-06-25 MED ORDER — METHYLPREDNISOLONE SODIUM SUCC 40 MG IJ SOLR
40.0000 mg | Freq: Every day | INTRAMUSCULAR | Status: DC
  Administered 2017-06-26: 40 mg via INTRAVENOUS
  Filled 2017-06-25: qty 1

## 2017-06-25 NOTE — Progress Notes (Signed)
PROGRESS NOTE    Connie Becker  ZOX:[id number] DOB: [birth year] DOA: 06/22/2017 PCP: Sigmund Hazel, MD Brief Narrative60 year old female with a history of essential hypertension, tobacco use came to the hospital with complains of cough, fevers and chills.  She states she started with cough about a week ago but this worsened over the weekend.  In the ER she was noted to be febrile and hypoxic with 85% on room air.  Chest x-ray showed bilateral lower lobe infiltrate concerning for pneumonia.  She was also influenza positive.  She was started on Rocephin and Tamiflu.     Assessment & Plan:   Principal Problem:   Community acquired pneumonia Active Problems:   Influenza A   Hyponatremia   Leucocytosis   Pneumonia   SOB (shortness of breath)  Bilateral lower lobe community-acquired pneumonia Influenza a positive Acute respiratory distress with hypoxia, 80% on room air -  patient was  on Ventimask given her hypoxia now on room air saturating above 92%. - She has been started on azithromycin and Rocephin which will continue -Continue Tamiflu -Nebulizer treatments, Solu-Medrol IV Taper Solu-Medrol. -Supplemental oxygen as needed, will wean off as deemed appropriate -Due to elevated BNP I will discontinue her fluids, give one-time of IV Lasix.  Leukocytosis probably related to steroids.  Active tobacco use -Counseled on tobacco cessation -Nicotine patch if necessary  Hypothyroidism -Continue Synthroid 75 mcg  Essential hypertension -Continue 5 mg orally     DVT prophylaxis:lovenox Code Status full Family Communication dw daughter Disposition Plan:tbd she is adamant about going home but I did tell her that she needs to stay today and see how she does before I can discharge her home. Consultants: none  Procedures Antimicrobials  Subjective:  Objective: Vitals:   06/24/17 2109 06/24/17 2110 06/25/17 0627 06/25/17 0744  BP:  (!) 142/76 (!) 149/86   Pulse:  78 79     Resp:  18 18   Temp:  98.8 F (37.1 C) 98.5 F (36.9 C)   TempSrc:  Oral Oral   SpO2: 90% 92% 93% 91%  Weight:      Height:        Intake/Output Summary (Last 24 hours) at 06/25/2017 1110 Last data filed at 06/25/2017 8119 Gross per 24 hour  Intake 830 ml  Output -  Net 830 ml   Filed Weights   06/22/17 2152 06/23/17 1919 06/24/17 0354  Weight: 52.2 kg (115 lb) 55 kg (121 lb 4.1 oz) 55.4 kg (122 lb 2.2 oz)    Examination:  General exam: Appears calm and comfortable  Respiratory system: rhonchi  to auscultation. Respiratory effort normal. Cardiovascular system: S1 & S2 heard, RRR. No JVD, murmurs, rubs, gallops or clicks. No pedal edema. Gastrointestinal system: Abdomen is nondistended, soft and nontender. No organomegaly or masses felt. Normal bowel sounds heard. Central nervous system: Alert and oriented. No focal neurological deficits. Extremities: Symmetric 5 x 5 power. Skin: No rashes, lesions or ulcers Psychiatry: Judgement and insight appear normal. Mood & affect appropriate.     Data Reviewed: I have personally reviewed following labs and imaging studies  CBC: Recent Labs  Lab 06/22/17 2104 06/23/17 0849 06/24/17 0334 06/25/17 0558  WBC 13.7* 8.7 17.2* 29.5*  NEUTROABS 12.6* 8.0*  --   --   HGB 10.9* 10.1* 10.0* 11.0*  HCT 31.2* 28.6* 27.9* 31.0*  MCV 90.2 89.1 86.9 87.8  PLT 176 168 190 238   Basic Metabolic Panel: Recent Labs  Lab 06/22/17 2104 06/23/17 0848 06/23/17 0849  06/24/17 0334 06/25/17 0558  NA 125*  --  124* 125* 135  K 4.7  --  3.5 3.9 3.7  CL 91*  --  90* 90* 97*  CO2 26  --  21* 22 23  GLUCOSE 146*  --  190* 124* 121*  BUN 13  --  10 12 14   CREATININE 0.68 0.61 0.58 0.71 0.65  CALCIUM 9.1  --  8.4* 8.6* 9.2  MG  --   --  1.4* 1.6* 2.2   GFR: Estimated Creatinine Clearance: 43.3 mL/min (by C-G formula based on SCr of 0.65 mg/dL). Liver Function Tests: Recent Labs  Lab 06/22/17 2104 06/23/17 0849 06/24/17 0334  06/25/17 0558  AST 59* 80* 80* 65*  ALT 32 39 37 39  ALKPHOS 93 83 74 82  BILITOT 0.5 0.5 0.5 0.6  PROT 7.5 6.6 6.6 6.8  ALBUMIN 3.8 3.3* 3.2* 3.1*   No results for input(s): LIPASE, AMYLASE in the last 168 hours. No results for input(s): AMMONIA in the last 168 hours. Coagulation Profile: No results for input(s): INR, PROTIME in the last 168 hours. Cardiac Enzymes: No results for input(s): CKTOTAL, CKMB, CKMBINDEX, TROPONINI in the last 168 hours. BNP (last 3 results) No results for input(s): PROBNP in the last 8760 hours. HbA1C: No results for input(s): HGBA1C in the last 72 hours. CBG: No results for input(s): GLUCAP in the last 168 hours. Lipid Profile: No results for input(s): CHOL, HDL, LDLCALC, TRIG, CHOLHDL, LDLDIRECT in the last 72 hours. Thyroid Function Tests: No results for input(s): TSH, T4TOTAL, FREET4, T3FREE, THYROIDAB in the last 72 hours. Anemia Panel: No results for input(s): VITAMINB12, FOLATE, FERRITIN, TIBC, IRON, RETICCTPCT in the last 72 hours. Sepsis Labs: Recent Labs  Lab 06/22/17 2135  LATICACIDVEN 1.63    Recent Results (from the past 240 hour(s))  Culture, blood (routine x 2)     Status: None (Preliminary result)   Collection Time: 06/22/17  9:29 PM  Result Value Ref Range Status   Specimen Description   Final    BLOOD RIGHT WRIST Performed at [facility], [address] [place], [place] [address]    Special Requests   Final    BOTTLES DRAWN AEROBIC AND ANAEROBIC Blood Culture adequate volume Performed at [facility], [address] [place], [place] [address]    Culture   Final    NO GROWTH 1 DAY Performed at [facility], [address], [place], [place] [address]    Report Status PENDING  Incomplete  Culture, blood (routine x 2)     Status: None (Preliminary result)   Collection Time: 06/22/17  9:34 PM  Result Value Ref Range Status   Specimen Description   Final    BLOOD RIGHT  ANTECUBITAL Performed at [facility], [address] [place], [place] [address]    Special Requests   Final    BOTTLES DRAWN AEROBIC AND ANAEROBIC Blood Culture adequate volume Performed at [facility], [address], [place], [place] [address]    Culture   Final    NO GROWTH 1 DAY Performed at [facility], [address], [place], [place] [address]    Report Status PENDING  Incomplete  Culture, blood (routine x 2) Call MD if unable to obtain prior to antibiotics being given     Status: None (Preliminary result)   Collection Time: 06/23/17  8:48 AM  Result Value Ref Range Status   Specimen  Description BLOOD RIGHT ANTECUBITAL  Final   Special Requests   Final    BOTTLES DRAWN AEROBIC AND ANAEROBIC Blood Culture adequate volume Performed at [facility], [address] [place], [place] [address]    Culture   Final    NO GROWTH < 24 HOURS Performed at [facility], [address] [place], [place] [address]    Report Status PENDING  Incomplete  Culture, blood (routine x 2) Call MD if unable to obtain prior to antibiotics being given     Status: None (Preliminary result)   Collection Time: 06/23/17  8:49 AM  Result Value Ref Range Status   Specimen Description   Final    BLOOD RIGHT FOREARM Performed at [facility], [address] [place], [place] [address]    Special Requests   Final    BOTTLES DRAWN AEROBIC AND ANAEROBIC Blood Culture adequate volume Performed at [facility], [address] [place], [place] [address]    Culture   Final    NO GROWTH < 24 HOURS Performed at [facility], [address] [place], [place] [address]    Report Status PENDING  Incomplete  MRSA PCR Screening     Status: None   Collection Time: 06/23/17  7:28 PM  Result Value Ref Range Status   MRSA by PCR NEGATIVE NEGATIVE Final    Comment:        The GeneXpert MRSA Assay  (FDA approved for NASAL specimens only), is one component of a comprehensive MRSA colonization surveillance program. It is not intended to diagnose MRSA infection nor to guide or monitor treatment for MRSA infections. Performed at [facility], [address] [place], [place] [address]          Radiology Studies: Dg Chest Port 1 View  Result Date: 06/24/2017 CLINICAL DATA:  Dyspnea EXAM: PORTABLE CHEST 1 VIEW COMPARISON:  06/22/2017 FINDINGS: Cardiac shadow is stable. Bibasilar infiltrates are noted increased from the prior exam. No sizable effusion or pneumothorax is noted. No bony abnormality is seen. IMPRESSION: Increased bibasilar infiltrates. Electronically Signed   By: Alcide CleverMark  Lukens M.D.   On: 06/24/2017 07:48        Scheduled Meds: . amLODipine  5 mg Oral QPM  . enoxaparin (LOVENOX) injection  40 mg Subcutaneous Q24H  . ipratropium-albuterol  3 mL Nebulization TID  . levothyroxine  75 mcg Oral QPC breakfast  . methylPREDNISolone (SOLU-MEDROL) injection  40 mg Intravenous Q12H  . multivitamin with minerals   Oral QPM  . oseltamivir  30 mg Oral BID   Continuous Infusions: . azithromycin Stopped (06/24/17 2201)  . cefTRIAXone (ROCEPHIN)  IV Stopped (06/24/17 2342)     LOS: 2 days      Alwyn RenElizabeth G Mathews, MD [facility] If 7PM-7AM, please contact night-coverage [email or web address] Password [email or web address] 06/25/2017, 11:10 AM

## 2017-06-26 ENCOUNTER — Inpatient Hospital Stay (HOSPITAL_COMMUNITY): Payer: Medicare Other

## 2017-06-26 LAB — BASIC METABOLIC PANEL
ANION GAP: 13 (ref 5–15)
BUN: 15 mg/dL (ref 6–20)
CHLORIDE: 99 mmol/L — AB (ref 101–111)
CO2: 25 mmol/L (ref 22–32)
Calcium: 9 mg/dL (ref 8.9–10.3)
Creatinine, Ser: 0.67 mg/dL (ref 0.44–1.00)
GFR calc non Af Amer: >60 mL/min (ref >60)
Glucose, Bld: 93 mg/dL (ref 65–99)
Potassium: 3.8 mmol/L (ref 3.5–5.1)
Sodium: 137 mmol/L (ref 135–145)

## 2017-06-26 LAB — CBC
HEMATOCRIT: 30.5 % — AB (ref 36.0–46.0)
HEMOGLOBIN: 10.8 g/dL — AB (ref 12.0–15.0)
MCH: 31.5 pg (ref 26.0–34.0)
MCHC: 35.4 g/dL (ref 30.0–36.0)
MCV: 88.9 fL (ref 78.0–100.0)
Platelets: 245 10*3/uL (ref 150–400)
RBC: 3.43 MIL/uL — ABNORMAL LOW (ref 3.87–5.11)
RDW: 14.6 % (ref 11.5–15.5)
WBC: 35.2 10*3/uL — AB (ref 4.0–10.5)

## 2017-06-26 NOTE — Progress Notes (Signed)
PROGRESS NOTE    Connie Becker  ZOX:[id number] DOB: [birth year] DOA: 06/22/2017 PCP: Sigmund Hazel, MD  Brief Narrative: 82 year old female with a history of essential hypertension, tobacco use came to the hospital with complains of cough, fevers and chills. She states she started with cough about a week ago but this worsened over the weekend. In the ER she was noted to be febrile and hypoxic with 85% on room air. Chest x-ray showed bilateral lower lobe infiltrate concerning for pneumonia. She was also influenza positive. She was started on Rocephin and Tamiflu.    Assessment & Plan:   Principal Problem:   Community acquired pneumonia Active Problems:   Influenza A   Hyponatremia   Leucocytosis   Pneumonia   SOB (shortness of breath)  Bilateral lower lobe community-acquired pneumonia Influenza a positive Acute respiratory distress with hypoxia, patient desaturated yesterday with ambulation.  8 2 % on room air. - She has been started on azithromycin and Rocephin which will continue -Continue Tamiflu -Nebulizer treatments, DC Solu-Medrol -Supplemental oxygen as needed, will wean off as deemed appropriate -Due to elevated BNP I will discontinue her fluids, give one-time of IV Lasix.  Worsening leukocytosis  Leukocytosis probably related to steroids, will check chest x-ray today.  Follow-up labs tomorrow morning.  Active tobacco use -Counseled on tobacco cessation -Nicotine patch if necessary  Hypothyroidism -Continue Synthroid 75 mcg  Essential hypertension -Continue 5 mg orally        DVT prophylaxis: Lovenox Code Status: Full code Family Communication discussed with daughter who was in the room Disposition Plan plan discharge 24-48 hours if she remains stable Consultants: None  Procedures: None antimicrobials: Rocephin azithromycin Tamiflu Subjective: Feels better cough better  Objective: Vitals:   06/25/17 2021 06/25/17 2236 06/26/17 0658 06/26/17 1006    BP:  (!) 144/69 (!) 145/86   Pulse:  77 66   Resp:  20 18   Temp:  99.1 F (37.3 C) 98.4 F (36.9 C)   TempSrc:  Oral Oral   SpO2: 92% (!) 87% 96% 93%  Weight:      Height:        Intake/Output Summary (Last 24 hours) at 06/26/2017 1043 Last data filed at 06/25/2017 1300 Gross per 24 hour  Intake 120 ml  Output -  Net 120 ml   Filed Weights   06/22/17 2152 06/23/17 1919 06/24/17 0354  Weight: 52.2 kg (115 lb) 55 kg (121 lb 4.1 oz) 55.4 kg (122 lb 2.2 oz)    Examination:  General exam: Appears calm and comfortable  Respiratory system: RHONCHI auscultation. Respiratory effort normal. Cardiovascular system: S1 & S2 heard, RRR. No JVD, murmurs, rubs, gallops or clicks. No pedal edema. Gastrointestinal system: Abdomen is nondistended, soft and nontender. No organomegaly or masses felt. Normal bowel sounds heard. Central nervous system: Alert and oriented. No focal neurological deficits. Extremities: Symmetric 5 x 5 power. Skin: No rashes, lesions or ulcers Psychiatry: Judgement and insight appear normal. Mood & affect appropriate.     Data Reviewed: I have personally reviewed following labs and imaging studies  CBC: Recent Labs  Lab 06/22/17 2104 06/23/17 0849 06/24/17 0334 06/25/17 0558 06/26/17 0635  WBC 13.7* 8.7 17.2* 29.5* 35.2*  NEUTROABS 12.6* 8.0*  --   --   --   HGB 10.9* 10.1* 10.0* 11.0* 10.8*  HCT 31.2* 28.6* 27.9* 31.0* 30.5*  MCV 90.2 89.1 86.9 87.8 88.9  PLT 176 168 190 238 245   Basic Metabolic Panel: Recent Labs  Lab 06/22/17  2104 06/23/17 0848 06/23/17 0849 06/24/17 0334 06/25/17 0558 06/26/17 0635  NA 125*  --  124* 125* 135 137  K 4.7  --  3.5 3.9 3.7 3.8  CL 91*  --  90* 90* 97* 99*  CO2 26  --  21* 22 23 25   GLUCOSE 146*  --  190* 124* 121* 93  BUN 13  --  10 12 14 15   CREATININE 0.68 0.61 0.58 0.71 0.65 0.67  CALCIUM 9.1  --  8.4* 8.6* 9.2 9.0  MG  --   --  1.4* 1.6* 2.2  --    GFR: Estimated Creatinine Clearance: 43.3 mL/min  (by C-G formula based on SCr of 0.67 mg/dL). Liver Function Tests: Recent Labs  Lab 06/22/17 2104 06/23/17 0849 06/24/17 0334 06/25/17 0558  AST 59* 80* 80* 65*  ALT 32 39 37 39  ALKPHOS 93 83 74 82  BILITOT 0.5 0.5 0.5 0.6  PROT 7.5 6.6 6.6 6.8  ALBUMIN 3.8 3.3* 3.2* 3.1*   No results for input(s): LIPASE, AMYLASE in the last 168 hours. No results for input(s): AMMONIA in the last 168 hours. Coagulation Profile: No results for input(s): INR, PROTIME in the last 168 hours. Cardiac Enzymes: No results for input(s): CKTOTAL, CKMB, CKMBINDEX, TROPONINI in the last 168 hours. BNP (last 3 results) No results for input(s): PROBNP in the last 8760 hours. HbA1C: No results for input(s): HGBA1C in the last 72 hours. CBG: No results for input(s): GLUCAP in the last 168 hours. Lipid Profile: No results for input(s): CHOL, HDL, LDLCALC, TRIG, CHOLHDL, LDLDIRECT in the last 72 hours. Thyroid Function Tests: No results for input(s): TSH, T4TOTAL, FREET4, T3FREE, THYROIDAB in the last 72 hours. Anemia Panel: No results for input(s): VITAMINB12, FOLATE, FERRITIN, TIBC, IRON, RETICCTPCT in the last 72 hours. Sepsis Labs: Recent Labs  Lab 06/22/17 2135  LATICACIDVEN 1.63    Recent Results (from the past 240 hour(s))  Culture, blood (routine x 2)     Status: None (Preliminary result)   Collection Time: 06/22/17  9:29 PM  Result Value Ref Range Status   Specimen Description   Final    BLOOD RIGHT WRIST Performed at [facility], [address] [place], [place] [address]    Special Requests   Final    BOTTLES DRAWN AEROBIC AND ANAEROBIC Blood Culture adequate volume Performed at [facility], [address] [place], [place] [address]    Culture   Final    NO GROWTH 2 DAYS Performed at [facility], [address] [place], [place] [address]    Report Status PENDING  Incomplete  Culture, blood (routine x 2)     Status: None (Preliminary result)    Collection Time: 06/22/17  9:34 PM  Result Value Ref Range Status   Specimen Description   Final    BLOOD RIGHT ANTECUBITAL Performed at [facility], [address] [place], [place] [address]    Special Requests   Final    BOTTLES DRAWN AEROBIC AND ANAEROBIC Blood Culture adequate volume Performed at [facility], [address] [place], [place] [address]    Culture   Final    NO GROWTH 2 DAYS Performed at [facility], [address] [place], [place] [address]    Report Status PENDING  Incomplete  Culture, blood (routine x 2) Call MD if unable to obtain prior to antibiotics being given     Status: None (Preliminary  result)   Collection Time: 06/23/17  8:48 AM  Result Value Ref Range Status   Specimen Description BLOOD RIGHT ANTECUBITAL  Final   Special Requests   Final    BOTTLES DRAWN AEROBIC AND ANAEROBIC Blood Culture adequate volume Performed at [facility] [address] [place], [place] [address]    Culture   Final    NO GROWTH 2 DAYS Performed at [facility], [address], [place], [place] [address]    Report Status PENDING  Incomplete  Culture, blood (routine x 2) Call MD if unable to obtain prior to antibiotics being given     Status: None (Preliminary result)   Collection Time: 06/23/17  8:49 AM  Result Value Ref Range Status   Specimen Description   Final    BLOOD RIGHT FOREARM Performed at [facility], [address] [place], [place] [address]    Special Requests   Final    BOTTLES DRAWN AEROBIC AND ANAEROBIC Blood Culture adequate volume Performed at [facility], [address], [place], [place] [address]    Culture   Final    NO GROWTH 2 DAYS Performed at [facility], [address], [place], [place] [address]    Report Status PENDING  Incomplete  MRSA PCR Screening     Status: None   Collection Time: 06/23/17  7:28 PM  Result  Value Ref Range Status   MRSA by PCR NEGATIVE NEGATIVE Final    Comment:        The GeneXpert MRSA Assay (FDA approved for NASAL specimens only), is one component of a comprehensive MRSA colonization surveillance program. It is not intended to diagnose MRSA infection nor to guide or monitor treatment for MRSA infections. Performed at [facility], [address], [place], [place] [address]          Radiology Studies: No results found.      Scheduled Meds: . amLODipine  5 mg Oral QPM  . enoxaparin (LOVENOX) injection  40 mg Subcutaneous Q24H  . ipratropium-albuterol  3 mL Nebulization TID  . levothyroxine  75 mcg Oral QPC breakfast  . methylPREDNISolone (SOLU-MEDROL) injection  40 mg Intravenous Daily  . multivitamin with minerals   Oral QPM  . oseltamivir  30 mg Oral BID   Continuous Infusions: . azithromycin 500 mg (06/25/17 2226)  . cefTRIAXone (ROCEPHIN)  IV 1 g (06/26/17 0000)     LOS: 3 days      Alwyn Ren, MD [facility]  If 7PM-7AM, please contact night-coverage [email or web address] Password [facility] 06/26/2017, 10:43 AM

## 2017-06-26 NOTE — Care Management Important Message (Signed)
Important Message  Patient Details IM Letter given to Rhonda/Case Manager to present to the Patient Name: Connie Becker MRN: [id number] Date of Birth: 05/25/1935   Medicare Important Message Given:  Yes    Caren MacadamFuller, Tyson Masin 06/26/2017, 10:14 AMImportant Message  Patient Details  Name: Connie Becker MRN: [id number] Date of Birth: 02/06/1936   Medicare Important Message Given:  Yes    Caren MacadamFuller, Burel Kahre 06/26/2017, 10:14 AM

## 2017-06-27 ENCOUNTER — Inpatient Hospital Stay (HOSPITAL_COMMUNITY): Payer: Medicare Other

## 2017-06-27 LAB — CBC
HEMATOCRIT: 32.4 % — AB (ref 36.0–46.0)
HEMOGLOBIN: 11.3 g/dL — AB (ref 12.0–15.0)
MCH: 31 pg (ref 26.0–34.0)
MCHC: 34.9 g/dL (ref 30.0–36.0)
MCV: 89 fL (ref 78.0–100.0)
Platelets: 305 10*3/uL (ref 150–400)
RBC: 3.64 MIL/uL — ABNORMAL LOW (ref 3.87–5.11)
RDW: 14.6 % (ref 11.5–15.5)
WBC: 35.8 10*3/uL — ABNORMAL HIGH (ref 4.0–10.5)

## 2017-06-27 MED ORDER — PREDNISONE 10 MG PO TABS: 10.0000 mg | ORAL_TABLET | Freq: Every day | ORAL | Status: DC

## 2017-06-27 MED ORDER — PREDNISONE 10 MG PO TABS: 10.0000 mg | ORAL_TABLET | Freq: Every day | ORAL | 0 refills | Status: AC

## 2017-06-27 MED ORDER — AZITHROMYCIN 250 MG PO TABS: 500.0000 mg | ORAL_TABLET | Freq: Every day | ORAL | Status: DC

## 2017-06-27 MED ORDER — AMOXICILLIN-POT CLAVULANATE 500-125 MG PO TABS: 1.0000 | ORAL_TABLET | Freq: Three times a day (TID) | ORAL | 0 refills | Status: AC

## 2017-06-27 MED ORDER — GUAIFENESIN-DM 100-10 MG/5ML PO SYRP: 5.0000 mL | ORAL_SOLUTION | ORAL | 0 refills | Status: AC | PRN

## 2017-06-27 MED ORDER — CEFPODOXIME PROXETIL 200 MG PO TABS
200.0000 mg | ORAL_TABLET | Freq: Two times a day (BID) | ORAL | Status: DC
  Administered 2017-06-27: 200 mg via ORAL
  Filled 2017-06-27: qty 1

## 2017-06-27 MED ORDER — CEFPODOXIME PROXETIL 200 MG PO TABS: 200.0000 mg | ORAL_TABLET | Freq: Two times a day (BID) | ORAL | 0 refills | Status: DC

## 2017-06-27 NOTE — Progress Notes (Signed)
Discharge instructions and medications discussed with patient and daughter.  AVS given to daughter. All questions answered.

## 2017-06-27 NOTE — Discharge Summary (Addendum)
Physician Discharge Summary  Connie BaleMae S Becker ZOX:[id number] DOB: 06/07/1935 DOA: 06/22/2017  PCP: Sigmund HazelMiller, Lisa, MD  Admit date: 06/22/2017 Discharge date: 06/27/2017  Admitted From: Home Disposition: Home Recommendations for Outpatient Follow-up:  1. Follow up with PCP Dr. Sigmund HazelLisa Miller on Monday the 18th at 12 noon. 2. Please obtain BMP/CBC on Monday at the doctor's appointment 3. Follow-up chest x-ray in 1 week.  Home Health: None Equipment/Devices none  Discharge Condition: Stable CODE STATUS full code Diet recommendation: Cardiac diet  Brief/Interim Summary: 82 year old female with a history of essential hypertension, tobacco use came to the hospital with complains of cough, fevers and chills. She states she started with cough about a week ago but this worsened over the weekend. In the ER she was noted to be febrile and hypoxic with 85% on room air. Chest x-ray showed bilateral lower lobe infiltrate concerning for pneumonia. She was also influenza positive. She was started on Rocephinand Tamiflu.    Discharge Diagnoses:  Principal Problem:   Community acquired pneumonia Active Problems:   Influenza A   Hyponatremia   Leucocytosis   Pneumonia   SOB (shortness of breath)  Bilateral lower lobe community-acquired pneumonia Influenza a positive-patient was treated with IV Rocephin and azithromycin  Along with nebulizers.  She was also given Tamiflu to finish the course of 5 days.  She has ambulated well without the oxygen and saturating above 92%.  However she does have an elevated white count which I think is probably secondary to IV steroids.  I have called Dr. Rondel BatonMiller's office and made an appointment for her for monday the 18th at 12 noon.  I have also told him that to get her labs CBC and BMP on the same day to follow-up on the leukocytosis.  Chest x-ray done on the day of discharge showed left upper lobe pneumonia.  Patient has been on IV antibiotics Rocephin and azithromycin  throughout her hospital stay.  She will be discharged on Augmentin 3 times a day for 5 days.  She will need a follow-up chest x-ray done in 1 week.  Patient has improved clinically with improving cough and shortness of breath.  She was able to ambulate in the hallway without oxygen.  Active tobacco use -Counseled on tobacco cessation -Nicotine patch if necessary  Hypothyroidism -Continue Synthroid 75 mcg  Essential hypertension -Continue 5 mg orally    Discharge Instructions  Discharge Instructions    Call MD for:  difficulty breathing, headache or visual disturbances   Complete by:  As directed    Call MD for:  difficulty breathing, headache or visual disturbances   Complete by:  As directed    Call MD for:  persistant nausea and vomiting   Complete by:  As directed    Call MD for:  temperature >100.4   Complete by:  As directed    Diet - low sodium heart healthy   Complete by:  As directed    Diet - low sodium heart healthy   Complete by:  As directed    Increase activity slowly   Complete by:  As directed    Increase activity slowly   Complete by:  As directed      Allergies as of 06/27/2017   No Known Allergies     Medication List    TAKE these medications   amLODipine 5 MG tablet Commonly known as:  NORVASC Take 5 mg by mouth every evening.   amoxicillin-clavulanate 500-125 MG tablet Commonly known as:  AUGMENTIN  Take 1 tablet (500 mg total) by mouth 3 (three) times daily.   guaiFENesin-dextromethorphan 100-10 MG/5ML syrup Commonly known as:  ROBITUSSIN DM Take 5 mLs by mouth every 4 (four) hours as needed for cough.   levothyroxine 75 MCG tablet Commonly known as:  SYNTHROID, LEVOTHROID Take 75 mcg by mouth daily after breakfast.   MULTIVITAMIN ADULT PO Take 1 tablet by mouth every evening.   predniSONE 10 MG tablet Commonly known as:  DELTASONE Take 1 tablet (10 mg total) by mouth daily with breakfast. Start taking on:  06/28/2017       Follow-up Information    Sigmund Hazel, MD Follow up.   Specialty:  Family Medicine Contact information: [address] [place] [address] [phone]          No Known Allergies  Consultations: none  Procedures/Studies: Dg Chest 1 View  Result Date: 06/27/2017 CLINICAL DATA:  Hypertension with cough and fever EXAM: CHEST 1 VIEW COMPARISON:  June 26, 2017 FINDINGS: There is new hazy opacity in the right upper lobe concerning for early pneumonia in this area. Atelectatic changes in the lung bases remain. There is no edema. Heart is mildly enlarged with pulmonary vascularity within normal limits. There is aortic atherosclerosis. No adenopathy. There is thoracolumbar dextroscoliosis. IMPRESSION: New hazy opacity right upper lobe concerning for developing pneumonia. Atelectasis in both lower lobes noted, stable. Stable cardiac silhouette. There is aortic atherosclerosis. Aortic Atherosclerosis (ICD10-I70.0). Electronically Signed   By: Bretta Bang III M.D.   On: 06/27/2017 12:18   Dg Chest 1 View  Result Date: 06/26/2017 CLINICAL DATA:  Hypoxia EXAM: CHEST 1 VIEW COMPARISON:  06/24/2017 FINDINGS: Cardiomegaly. Mild interstitial and alveolar opacities, improved significantly since prior study compatible with improving edema. No visible effusions or acute bony abnormality. IMPRESSION: Improving bilateral interstitial and alveolar opacities, likely improving edema/CHF. Electronically Signed   By: Charlett Nose M.D.   On: 06/26/2017 12:27   Dg Chest 2 View  Result Date: 06/22/2017 CLINICAL DATA:  Cough and congestion EXAM: CHEST  2 VIEW COMPARISON:  December 17, 2007 FINDINGS: There is patchy airspace opacity in both mid and lower lung zones. Heart is upper normal in size with pulmonary vascularity within normal limits. No adenopathy. There is aortic atherosclerosis. No evident bone lesions. IMPRESSION: Patchy infiltrate in both mid and lower lung zones consistent with  multifocal pneumonia. Heart upper normal in size. No evident adenopathy. There is aortic atherosclerosis. Aortic Atherosclerosis (ICD10-I70.0). Followup PA and lateral chest radiographs recommended in 3-4 weeks following trial of antibiotic therapy to ensure resolution and exclude underlying malignancy. Electronically Signed   By: Bretta Bang III M.D.   On: 06/22/2017 21:26   Dg Chest Port 1 View  Result Date: 06/24/2017 CLINICAL DATA:  Dyspnea EXAM: PORTABLE CHEST 1 VIEW COMPARISON:  06/22/2017 FINDINGS: Cardiac shadow is stable. Bibasilar infiltrates are noted increased from the prior exam. No sizable effusion or pneumothorax is noted. No bony abnormality is seen. IMPRESSION: Increased bibasilar infiltrates. Electronically Signed   By: Alcide Clever M.D.   On: 06/24/2017 07:48   (Echo, Carotid, EGD, Colonoscopy, ERCP)    Subjective:   Discharge Exam: Vitals:   06/27/17 0554 06/27/17 0934  BP: (!) 160/77   Pulse: 67 69  Resp: 18 17  Temp: 98.7 F (37.1 C)   SpO2: 95% 96%   Vitals:   06/26/17 2043 06/26/17 2057 06/27/17 0554 06/27/17 0934  BP:  (!) 156/79 (!) 160/77   Pulse:  79 67 69  Resp:  18 18 17   Temp:  98 F (36.7 C) 98.7 F (37.1 C)   TempSrc:  Oral Oral   SpO2: 97% 95% 95% 96%  Weight:      Height:        General: Pt is alert, awake, not in acute distress Cardiovascular: RRR, S1/S2 +, no rubs, no gallops Respiratory: few rhonchi bilaterally, no wheezing, no rhonchi Abdominal: Soft, NT, ND, bowel sounds + Extremities: no edema, no cyanosis    The results of significant diagnostics from this hospitalization (including imaging, microbiology, ancillary and laboratory) are listed below for reference.     Microbiology: Recent Results (from the past 240 hour(s))  Culture, blood (routine x 2)     Status: None (Preliminary result)   Collection Time: 06/22/17  9:29 PM  Result Value Ref Range Status   Specimen Description   Final    BLOOD RIGHT WRIST Performed  at [facility], [address], [place], [place] [address]    Special Requests   Final    BOTTLES DRAWN AEROBIC AND ANAEROBIC Blood Culture adequate volume Performed at [facility], [address], [place], [place] [address]    Culture   Final    NO GROWTH 3 DAYS Performed at [facility], [address], [place], [place] [address]    Report Status PENDING  Incomplete  Culture, blood (routine x 2)     Status: None (Preliminary result)   Collection Time: 06/22/17  9:34 PM  Result Value Ref Range Status   Specimen Description   Final    BLOOD RIGHT ANTECUBITAL Performed at [facility], [address] [place], [place] [address]    Special Requests   Final    BOTTLES DRAWN AEROBIC AND ANAEROBIC Blood Culture adequate volume Performed at [facility], [address], [place], [place] [address]    Culture   Final    NO GROWTH 3 DAYS Performed at [facility], [address] [place], [place] [address]    Report Status PENDING  Incomplete  Culture, blood (routine x 2) Call MD if unable to obtain prior to antibiotics being given     Status: None (Preliminary result)   Collection Time: 06/23/17  8:48 AM  Result Value Ref Range Status   Specimen Description BLOOD RIGHT ANTECUBITAL  Final   Special Requests   Final    BOTTLES DRAWN AEROBIC AND ANAEROBIC Blood Culture adequate volume Performed at [facility], [address] [place], [place] [address]    Culture   Final    NO GROWTH 3 DAYS Performed at [facility], [address], [place], [place] [address]    Report Status PENDING  Incomplete  Culture, blood (routine x 2) Call MD if unable to obtain prior to antibiotics being given     Status: None (Preliminary result)   Collection Time: 06/23/17  8:49 AM  Result Value Ref Range Status   Specimen Description   Final    BLOOD RIGHT FOREARM Performed at [facility], [address] [place], [place] [address]    Special Requests   Final    BOTTLES DRAWN AEROBIC AND ANAEROBIC Blood Culture adequate volume Performed at [facility], [address] [place], [place] [address]    Culture   Final    NO GROWTH 3 DAYS Performed at [facility], [address] [place], [place] [address]  Report Status PENDING  Incomplete  MRSA PCR Screening     Status: None   Collection Time: 06/23/17  7:28 PM  Result Value Ref Range Status   MRSA by PCR NEGATIVE NEGATIVE Final    Comment:        The GeneXpert MRSA Assay (FDA approved for NASAL specimens only), is one component of a comprehensive MRSA colonization surveillance program. It is not intended to diagnose MRSA infection nor to guide or monitor treatment for MRSA infections. Performed at [facility], [address] [place], [place] [address]      Labs: BNP (last 3 results) Recent Labs    06/23/17 0847 06/23/17 1032  BNP 593.1* 772.4*   Basic Metabolic Panel: Recent Labs  Lab 06/22/17 2104 06/23/17 0848 06/23/17 0849 06/24/17 0334 06/25/17 0558 06/26/17 0635  NA 125*  --  124* 125* 135 137  K 4.7  --  3.5 3.9 3.7 3.8  CL 91*  --  90* 90* 97* 99*  CO2 26  --  21* 22 23 25   GLUCOSE 146*  --  190* 124* 121* 93  BUN 13  --  10 12 14 15   CREATININE 0.68 0.61 0.58 0.71 0.65 0.67  CALCIUM 9.1  --  8.4* 8.6* 9.2 9.0  MG  --   --  1.4* 1.6* 2.2  --    Liver Function Tests: Recent Labs  Lab 06/22/17 2104 06/23/17 0849 06/24/17 0334 06/25/17 0558  AST 59* 80* 80* 65*  ALT 32 39 37 39  ALKPHOS 93 83 74 82  BILITOT 0.5 0.5 0.5 0.6  PROT 7.5 6.6 6.6 6.8  ALBUMIN 3.8 3.3* 3.2* 3.1*   No results for input(s): LIPASE, AMYLASE in the last 168 hours. No results for input(s): AMMONIA in the last 168 hours. CBC: Recent Labs  Lab 06/22/17 2104 06/23/17 0849 06/24/17 0334 06/25/17 0558 06/26/17 0635 06/27/17 0605  WBC 13.7* 8.7  17.2* 29.5* 35.2* 35.8*  NEUTROABS 12.6* 8.0*  --   --   --   --   HGB 10.9* 10.1* 10.0* 11.0* 10.8* 11.3*  HCT 31.2* 28.6* 27.9* 31.0* 30.5* 32.4*  MCV 90.2 89.1 86.9 87.8 88.9 89.0  PLT 176 168 190 238 245 305   Cardiac Enzymes: No results for input(s): CKTOTAL, CKMB, CKMBINDEX, TROPONINI in the last 168 hours. BNP: Invalid input(s): POCBNP CBG: No results for input(s): GLUCAP in the last 168 hours. D-Dimer No results for input(s): DDIMER in the last 72 hours. Hgb A1c No results for input(s): HGBA1C in the last 72 hours. Lipid Profile No results for input(s): CHOL, HDL, LDLCALC, TRIG, CHOLHDL, LDLDIRECT in the last 72 hours. Thyroid function studies No results for input(s): TSH, T4TOTAL, T3FREE, THYROIDAB in the last 72 hours.  Invalid input(s): FREET3 Anemia work up No results for input(s): VITAMINB12, FOLATE, FERRITIN, TIBC, IRON, RETICCTPCT in the last 72 hours. Urinalysis    Component Value Date/Time   COLORURINE YELLOW 06/22/2017 2104   APPEARANCEUR HAZY (A) 06/22/2017 2104   LABSPEC 1.016 06/22/2017 2104   PHURINE 5.0 06/22/2017 2104   GLUCOSEU 50 (A) 06/22/2017 2104   HGBUR NEGATIVE 06/22/2017 2104   BILIRUBINUR NEGATIVE 06/22/2017 2104   KETONESUR 5 (A) 06/22/2017 2104   PROTEINUR 30 (A) 06/22/2017 2104   UROBILINOGEN 0.2 06/15/2007 0847   NITRITE NEGATIVE 06/22/2017 2104   LEUKOCYTESUR NEGATIVE 06/22/2017 2104   Sepsis Labs Invalid input(s): PROCALCITONIN,  WBC,  LACTICIDVEN Microbiology Recent Results (from the past 240 hour(s))  Culture, blood (routine  x 2)     Status: None (Preliminary result)   Collection Time: 06/22/17  9:29 PM  Result Value Ref Range Status   Specimen Description   Final    BLOOD RIGHT WRIST Performed at [facility], [address], [place], [place] [address]    Special Requests   Final    BOTTLES DRAWN AEROBIC AND ANAEROBIC Blood Culture adequate volume Performed at [facility], [address]  [place], [place] [address]    Culture   Final    NO GROWTH 3 DAYS Performed at [facility], [address] [place], [place] [address]    Report Status PENDING  Incomplete  Culture, blood (routine x 2)     Status: None (Preliminary result)   Collection Time: 06/22/17  9:34 PM  Result Value Ref Range Status   Specimen Description   Final    BLOOD RIGHT ANTECUBITAL Performed at [facility], [address], [place], [place] [address]    Special Requests   Final    BOTTLES DRAWN AEROBIC AND ANAEROBIC Blood Culture adequate volume Performed at [facility], [address], [place], [place] [address]    Culture   Final    NO GROWTH 3 DAYS Performed at [facility], [address] [place], [place] [address]    Report Status PENDING  Incomplete  Culture, blood (routine x 2) Call MD if unable to obtain prior to antibiotics being given     Status: None (Preliminary result)   Collection Time: 06/23/17  8:48 AM  Result Value Ref Range Status   Specimen Description BLOOD RIGHT ANTECUBITAL  Final   Special Requests   Final    BOTTLES DRAWN AEROBIC AND ANAEROBIC Blood Culture adequate volume Performed at [facility], [address] [place], [place] [address]    Culture   Final    NO GROWTH 3 DAYS Performed at [facility], [address] [place], [place] [address]    Report Status PENDING  Incomplete  Culture, blood (routine x 2) Call MD if unable to obtain prior to antibiotics being given     Status: None (Preliminary result)   Collection Time: 06/23/17  8:49 AM  Result Value Ref Range Status   Specimen Description   Final    BLOOD RIGHT FOREARM Performed at [facility], [address] [place], [place] [address]    Special Requests   Final    BOTTLES DRAWN AEROBIC AND ANAEROBIC Blood Culture adequate volume Performed at [facility], [address], [place], [place]  [address]    Culture   Final    NO GROWTH 3 DAYS Performed at [facility], [address] [place], [place] [address]    Report Status PENDING  Incomplete  MRSA PCR Screening     Status: None   Collection Time: 06/23/17  7:28 PM  Result Value Ref Range Status   MRSA by PCR NEGATIVE NEGATIVE Final    Comment:        The GeneXpert MRSA Assay (FDA approved for NASAL specimens only), is one component of a comprehensive MRSA colonization surveillance program. It is not intended to diagnose MRSA infection nor to guide or monitor treatment for MRSA infections. Performed at [facility], [address], [place], [place] [address]      Time coordinating discharge: Over 30 minutes  SIGNED:   Gardiner Ramus  Jerolyn Center, MD  [facility] 06/27/2017, 12:53 PM  If 7PM-7AM, please contact night-coverage [email or web address] Password [email or web address]

## 2017-06-27 NOTE — Progress Notes (Signed)
PHARMACIST - PHYSICIAN COMMUNICATION DR:   Ashley RoyaltyMatthews CONCERNING: Antibiotic IV to Oral Route Change Policy  RECOMMENDATION: This patient is receiving azithromycin by the intravenous route.  Based on criteria approved by the Pharmacy and Therapeutics Committee, the antibiotic(s) is/are being converted to the equivalent oral dose form(s).   DESCRIPTION: These criteria include:  Patient being treated for a respiratory tract infection, urinary tract infection, cellulitis or clostridium difficile associated diarrhea if on metronidazole  The patient is not neutropenic and does not exhibit a GI malabsorption state  The patient is eating (either orally or via tube) and/or has been taking other orally administered medications for a least 24 hours  The patient is improving clinically and has a Tmax < 100.5  If you have questions about this conversion, please contact the Pharmacy Department  []   [phone]( [phone] )  [facility] []   [phone]( [phone] )  [facility] []   [phone]( [phone] )  [facility] []   [phone]  [facility] [x]   [phone]  [facility]   Dorna LeitzAnh Estle Sabella, PharmD, BCPS 06/27/2017 9:40 AM

## 2017-06-28 LAB — CULTURE, BLOOD (ROUTINE X 2)
CULTURE: NO GROWTH
CULTURE: NO GROWTH
Culture: NO GROWTH
Culture: NO GROWTH
SPECIAL REQUESTS: ADEQUATE
SPECIAL REQUESTS: ADEQUATE
Special Requests: ADEQUATE
Special Requests: ADEQUATE

## 2017-07-01 ENCOUNTER — Other Ambulatory Visit (HOSPITAL_COMMUNITY): Payer: Self-pay | Admitting: Family Medicine

## 2017-07-01 DIAGNOSIS — R7989 Other specified abnormal findings of blood chemistry: Secondary | ICD-10-CM

## 2017-07-10 ENCOUNTER — Ambulatory Visit (HOSPITAL_COMMUNITY)
Admission: RE | Admit: 2017-07-10 | Discharge: 2017-07-10 | Disposition: A | Discharge status: Home / Self Care | Payer: Medicare Other | Source: Ambulatory Visit | Attending: Family Medicine | Admitting: Family Medicine

## 2017-07-10 DIAGNOSIS — R7989 Other specified abnormal findings of blood chemistry: Secondary | ICD-10-CM | POA: Diagnosis not present

## 2017-07-10 DIAGNOSIS — I10 Essential (primary) hypertension: Secondary | ICD-10-CM | POA: Insufficient documentation

## 2017-07-10 DIAGNOSIS — Z720 Tobacco use: Secondary | ICD-10-CM | POA: Insufficient documentation

## 2017-07-10 DIAGNOSIS — E039 Hypothyroidism, unspecified: Secondary | ICD-10-CM | POA: Insufficient documentation

## 2017-07-10 NOTE — Progress Notes (Signed)
  Echocardiogram 2D Echocardiogram has been performed.  Roosvelt MaserLane, Delesha Pohlman F 07/10/2017, 11:15 AM

## 2019-11-16 DIAGNOSIS — E785 Hyperlipidemia, unspecified: Secondary | ICD-10-CM | POA: Diagnosis not present

## 2019-11-16 DIAGNOSIS — M1990 Unspecified osteoarthritis, unspecified site: Secondary | ICD-10-CM | POA: Diagnosis not present

## 2019-11-16 DIAGNOSIS — E039 Hypothyroidism, unspecified: Secondary | ICD-10-CM | POA: Diagnosis not present

## 2019-11-16 DIAGNOSIS — I10 Essential (primary) hypertension: Secondary | ICD-10-CM | POA: Diagnosis not present

## 2019-11-16 DIAGNOSIS — Z720 Tobacco use: Secondary | ICD-10-CM | POA: Diagnosis not present

## 2020-06-12 DIAGNOSIS — Z23 Encounter for immunization: Secondary | ICD-10-CM | POA: Diagnosis not present

## 2020-06-12 DIAGNOSIS — I700 Atherosclerosis of aorta: Secondary | ICD-10-CM | POA: Diagnosis not present

## 2020-06-12 DIAGNOSIS — I10 Essential (primary) hypertension: Secondary | ICD-10-CM | POA: Diagnosis not present

## 2020-06-12 DIAGNOSIS — Z0000 Encounter for general adult medical examination without abnormal findings: Secondary | ICD-10-CM | POA: Diagnosis not present

## 2020-06-12 DIAGNOSIS — H6121 Impacted cerumen, right ear: Secondary | ICD-10-CM | POA: Diagnosis not present

## 2020-06-12 DIAGNOSIS — Z6823 Body mass index (BMI) 23.0-23.9, adult: Secondary | ICD-10-CM | POA: Diagnosis not present

## 2020-06-12 DIAGNOSIS — E039 Hypothyroidism, unspecified: Secondary | ICD-10-CM | POA: Diagnosis not present

## 2020-06-12 DIAGNOSIS — F17210 Nicotine dependence, cigarettes, uncomplicated: Secondary | ICD-10-CM | POA: Diagnosis not present

## 2020-09-12 DIAGNOSIS — Z23 Encounter for immunization: Secondary | ICD-10-CM | POA: Diagnosis not present

## 2020-10-04 DIAGNOSIS — Z961 Presence of intraocular lens: Secondary | ICD-10-CM | POA: Diagnosis not present

## 2020-10-04 DIAGNOSIS — H52203 Unspecified astigmatism, bilateral: Secondary | ICD-10-CM | POA: Diagnosis not present

## 2020-12-20 DIAGNOSIS — M5430 Sciatica, unspecified side: Secondary | ICD-10-CM | POA: Diagnosis not present

## 2021-01-04 DIAGNOSIS — M2569 Stiffness of other specified joint, not elsewhere classified: Secondary | ICD-10-CM | POA: Diagnosis not present

## 2021-01-04 DIAGNOSIS — R293 Abnormal posture: Secondary | ICD-10-CM | POA: Diagnosis not present

## 2021-01-04 DIAGNOSIS — M5431 Sciatica, right side: Secondary | ICD-10-CM | POA: Diagnosis not present

## 2021-01-04 DIAGNOSIS — M5432 Sciatica, left side: Secondary | ICD-10-CM | POA: Diagnosis not present

## 2021-01-04 DIAGNOSIS — M6281 Muscle weakness (generalized): Secondary | ICD-10-CM | POA: Diagnosis not present

## 2021-01-08 DIAGNOSIS — M5432 Sciatica, left side: Secondary | ICD-10-CM | POA: Diagnosis not present

## 2021-01-08 DIAGNOSIS — M2569 Stiffness of other specified joint, not elsewhere classified: Secondary | ICD-10-CM | POA: Diagnosis not present

## 2021-01-08 DIAGNOSIS — M6281 Muscle weakness (generalized): Secondary | ICD-10-CM | POA: Diagnosis not present

## 2021-01-08 DIAGNOSIS — M5431 Sciatica, right side: Secondary | ICD-10-CM | POA: Diagnosis not present

## 2021-01-08 DIAGNOSIS — R293 Abnormal posture: Secondary | ICD-10-CM | POA: Diagnosis not present

## 2021-01-10 DIAGNOSIS — R293 Abnormal posture: Secondary | ICD-10-CM | POA: Diagnosis not present

## 2021-01-10 DIAGNOSIS — M5431 Sciatica, right side: Secondary | ICD-10-CM | POA: Diagnosis not present

## 2021-01-10 DIAGNOSIS — M2569 Stiffness of other specified joint, not elsewhere classified: Secondary | ICD-10-CM | POA: Diagnosis not present

## 2021-01-10 DIAGNOSIS — M6281 Muscle weakness (generalized): Secondary | ICD-10-CM | POA: Diagnosis not present

## 2021-01-10 DIAGNOSIS — M5432 Sciatica, left side: Secondary | ICD-10-CM | POA: Diagnosis not present

## 2021-01-16 DIAGNOSIS — M5432 Sciatica, left side: Secondary | ICD-10-CM | POA: Diagnosis not present

## 2021-01-16 DIAGNOSIS — M6281 Muscle weakness (generalized): Secondary | ICD-10-CM | POA: Diagnosis not present

## 2021-01-16 DIAGNOSIS — R293 Abnormal posture: Secondary | ICD-10-CM | POA: Diagnosis not present

## 2021-01-16 DIAGNOSIS — M5431 Sciatica, right side: Secondary | ICD-10-CM | POA: Diagnosis not present

## 2021-01-16 DIAGNOSIS — M2569 Stiffness of other specified joint, not elsewhere classified: Secondary | ICD-10-CM | POA: Diagnosis not present

## 2021-01-17 DIAGNOSIS — M6281 Muscle weakness (generalized): Secondary | ICD-10-CM | POA: Diagnosis not present

## 2021-01-17 DIAGNOSIS — M5431 Sciatica, right side: Secondary | ICD-10-CM | POA: Diagnosis not present

## 2021-01-17 DIAGNOSIS — M2569 Stiffness of other specified joint, not elsewhere classified: Secondary | ICD-10-CM | POA: Diagnosis not present

## 2021-01-17 DIAGNOSIS — R293 Abnormal posture: Secondary | ICD-10-CM | POA: Diagnosis not present

## 2021-01-17 DIAGNOSIS — M5432 Sciatica, left side: Secondary | ICD-10-CM | POA: Diagnosis not present

## 2021-01-23 DIAGNOSIS — R293 Abnormal posture: Secondary | ICD-10-CM | POA: Diagnosis not present

## 2021-01-23 DIAGNOSIS — M5432 Sciatica, left side: Secondary | ICD-10-CM | POA: Diagnosis not present

## 2021-01-23 DIAGNOSIS — M6281 Muscle weakness (generalized): Secondary | ICD-10-CM | POA: Diagnosis not present

## 2021-01-23 DIAGNOSIS — M5431 Sciatica, right side: Secondary | ICD-10-CM | POA: Diagnosis not present

## 2021-01-23 DIAGNOSIS — M2569 Stiffness of other specified joint, not elsewhere classified: Secondary | ICD-10-CM | POA: Diagnosis not present

## 2021-01-24 DIAGNOSIS — R293 Abnormal posture: Secondary | ICD-10-CM | POA: Diagnosis not present

## 2021-01-24 DIAGNOSIS — M6281 Muscle weakness (generalized): Secondary | ICD-10-CM | POA: Diagnosis not present

## 2021-01-24 DIAGNOSIS — M5431 Sciatica, right side: Secondary | ICD-10-CM | POA: Diagnosis not present

## 2021-01-24 DIAGNOSIS — M5432 Sciatica, left side: Secondary | ICD-10-CM | POA: Diagnosis not present

## 2021-01-24 DIAGNOSIS — M2569 Stiffness of other specified joint, not elsewhere classified: Secondary | ICD-10-CM | POA: Diagnosis not present

## 2021-01-30 DIAGNOSIS — R293 Abnormal posture: Secondary | ICD-10-CM | POA: Diagnosis not present

## 2021-01-30 DIAGNOSIS — M5431 Sciatica, right side: Secondary | ICD-10-CM | POA: Diagnosis not present

## 2021-01-30 DIAGNOSIS — M5432 Sciatica, left side: Secondary | ICD-10-CM | POA: Diagnosis not present

## 2021-01-30 DIAGNOSIS — M2569 Stiffness of other specified joint, not elsewhere classified: Secondary | ICD-10-CM | POA: Diagnosis not present

## 2021-01-30 DIAGNOSIS — M6281 Muscle weakness (generalized): Secondary | ICD-10-CM | POA: Diagnosis not present

## 2021-01-31 DIAGNOSIS — M5431 Sciatica, right side: Secondary | ICD-10-CM | POA: Diagnosis not present

## 2021-01-31 DIAGNOSIS — M5432 Sciatica, left side: Secondary | ICD-10-CM | POA: Diagnosis not present

## 2021-01-31 DIAGNOSIS — R293 Abnormal posture: Secondary | ICD-10-CM | POA: Diagnosis not present

## 2021-01-31 DIAGNOSIS — M2569 Stiffness of other specified joint, not elsewhere classified: Secondary | ICD-10-CM | POA: Diagnosis not present

## 2021-01-31 DIAGNOSIS — M6281 Muscle weakness (generalized): Secondary | ICD-10-CM | POA: Diagnosis not present

## 2021-02-13 DIAGNOSIS — M5432 Sciatica, left side: Secondary | ICD-10-CM | POA: Diagnosis not present

## 2021-02-13 DIAGNOSIS — M6281 Muscle weakness (generalized): Secondary | ICD-10-CM | POA: Diagnosis not present

## 2021-02-13 DIAGNOSIS — R293 Abnormal posture: Secondary | ICD-10-CM | POA: Diagnosis not present

## 2021-02-13 DIAGNOSIS — M5431 Sciatica, right side: Secondary | ICD-10-CM | POA: Diagnosis not present

## 2021-02-13 DIAGNOSIS — M2569 Stiffness of other specified joint, not elsewhere classified: Secondary | ICD-10-CM | POA: Diagnosis not present

## 2021-02-14 DIAGNOSIS — M2569 Stiffness of other specified joint, not elsewhere classified: Secondary | ICD-10-CM | POA: Diagnosis not present

## 2021-02-14 DIAGNOSIS — R293 Abnormal posture: Secondary | ICD-10-CM | POA: Diagnosis not present

## 2021-02-14 DIAGNOSIS — M5431 Sciatica, right side: Secondary | ICD-10-CM | POA: Diagnosis not present

## 2021-02-14 DIAGNOSIS — M6281 Muscle weakness (generalized): Secondary | ICD-10-CM | POA: Diagnosis not present

## 2021-02-14 DIAGNOSIS — M5432 Sciatica, left side: Secondary | ICD-10-CM | POA: Diagnosis not present

## 2021-02-16 DIAGNOSIS — Z23 Encounter for immunization: Secondary | ICD-10-CM | POA: Diagnosis not present

## 2021-02-20 DIAGNOSIS — M6281 Muscle weakness (generalized): Secondary | ICD-10-CM | POA: Diagnosis not present

## 2021-02-20 DIAGNOSIS — M2569 Stiffness of other specified joint, not elsewhere classified: Secondary | ICD-10-CM | POA: Diagnosis not present

## 2021-02-20 DIAGNOSIS — M5432 Sciatica, left side: Secondary | ICD-10-CM | POA: Diagnosis not present

## 2021-02-20 DIAGNOSIS — R293 Abnormal posture: Secondary | ICD-10-CM | POA: Diagnosis not present

## 2021-02-20 DIAGNOSIS — M5431 Sciatica, right side: Secondary | ICD-10-CM | POA: Diagnosis not present

## 2021-03-02 ENCOUNTER — Ambulatory Visit: Payer: Self-pay | Attending: Internal Medicine

## 2021-03-02 ENCOUNTER — Other Ambulatory Visit (HOSPITAL_BASED_OUTPATIENT_CLINIC_OR_DEPARTMENT_OTHER): Payer: Self-pay

## 2021-03-02 ENCOUNTER — Other Ambulatory Visit: Payer: Self-pay

## 2021-03-02 DIAGNOSIS — Z23 Encounter for immunization: Secondary | ICD-10-CM

## 2021-03-02 MED ORDER — PFIZER COVID-19 VAC BIVALENT 30 MCG/0.3ML IM SUSP
INTRAMUSCULAR | 0 refills | Status: AC
  Filled 2021-03-02: qty 0.3, 1d supply, fill #0

## 2021-03-02 NOTE — Progress Notes (Signed)
   Covid-19 Vaccination Clinic  Name:  Connie Becker    MRN: [id number] DOB: 01-May-1936  03/02/2021  Ms. Frier was observed post Covid-19 immunization for 15 minutes without incident. She was provided with Vaccine Information Sheet and instruction to access the V-Safe system.   Ms. Zabriskie was instructed to call [phone] with any severe reactions post vaccine: Difficulty breathing  Swelling of face and throat  A fast heartbeat  A bad rash all over body  Dizziness and weakness   Immunizations Administered     Name Date Dose VIS Date Route   Pfizer Covid-19 Vaccine Bivalent Booster 03/02/2021 10:00 AM 0.3 mL 01/10/2021 Intramuscular   Manufacturer: [employer], [employer]   Lot: [id number]   NDC: [id number]

## 2021-05-10 DIAGNOSIS — E785 Hyperlipidemia, unspecified: Secondary | ICD-10-CM | POA: Diagnosis not present

## 2021-05-10 DIAGNOSIS — E890 Postprocedural hypothyroidism: Secondary | ICD-10-CM | POA: Diagnosis not present

## 2021-05-10 DIAGNOSIS — Z8249 Family history of ischemic heart disease and other diseases of the circulatory system: Secondary | ICD-10-CM | POA: Diagnosis not present

## 2021-05-10 DIAGNOSIS — F17210 Nicotine dependence, cigarettes, uncomplicated: Secondary | ICD-10-CM | POA: Diagnosis not present

## 2021-05-10 DIAGNOSIS — G8929 Other chronic pain: Secondary | ICD-10-CM | POA: Diagnosis not present

## 2021-05-10 DIAGNOSIS — I10 Essential (primary) hypertension: Secondary | ICD-10-CM | POA: Diagnosis not present

## 2021-05-10 DIAGNOSIS — Z833 Family history of diabetes mellitus: Secondary | ICD-10-CM | POA: Diagnosis not present

## 2021-05-25 DIAGNOSIS — M25561 Pain in right knee: Secondary | ICD-10-CM | POA: Diagnosis not present

## 2021-06-01 DIAGNOSIS — M25561 Pain in right knee: Secondary | ICD-10-CM | POA: Diagnosis not present

## 2021-06-01 DIAGNOSIS — Z6821 Body mass index (BMI) 21.0-21.9, adult: Secondary | ICD-10-CM | POA: Diagnosis not present

## 2021-06-06 DIAGNOSIS — M25561 Pain in right knee: Secondary | ICD-10-CM | POA: Diagnosis not present

## 2021-06-06 DIAGNOSIS — M25562 Pain in left knee: Secondary | ICD-10-CM | POA: Diagnosis not present

## 2021-06-11 DIAGNOSIS — M170 Bilateral primary osteoarthritis of knee: Secondary | ICD-10-CM | POA: Diagnosis not present

## 2021-06-14 DIAGNOSIS — M1711 Unilateral primary osteoarthritis, right knee: Secondary | ICD-10-CM | POA: Diagnosis not present

## 2021-06-15 DIAGNOSIS — I10 Essential (primary) hypertension: Secondary | ICD-10-CM | POA: Diagnosis not present

## 2021-06-15 DIAGNOSIS — M1711 Unilateral primary osteoarthritis, right knee: Secondary | ICD-10-CM | POA: Diagnosis not present

## 2021-06-15 DIAGNOSIS — E039 Hypothyroidism, unspecified: Secondary | ICD-10-CM | POA: Diagnosis not present

## 2021-06-15 DIAGNOSIS — I700 Atherosclerosis of aorta: Secondary | ICD-10-CM | POA: Diagnosis not present

## 2021-06-15 DIAGNOSIS — Z720 Tobacco use: Secondary | ICD-10-CM | POA: Diagnosis not present

## 2021-06-15 DIAGNOSIS — Z0000 Encounter for general adult medical examination without abnormal findings: Secondary | ICD-10-CM | POA: Diagnosis not present

## 2021-06-15 DIAGNOSIS — Z1389 Encounter for screening for other disorder: Secondary | ICD-10-CM | POA: Diagnosis not present

## 2021-08-06 DIAGNOSIS — I10 Essential (primary) hypertension: Secondary | ICD-10-CM | POA: Diagnosis not present

## 2021-10-10 DIAGNOSIS — H52203 Unspecified astigmatism, bilateral: Secondary | ICD-10-CM | POA: Diagnosis not present

## 2021-10-10 DIAGNOSIS — Z961 Presence of intraocular lens: Secondary | ICD-10-CM | POA: Diagnosis not present

## 2022-01-09 DIAGNOSIS — Z833 Family history of diabetes mellitus: Secondary | ICD-10-CM | POA: Diagnosis not present

## 2022-01-09 DIAGNOSIS — M1990 Unspecified osteoarthritis, unspecified site: Secondary | ICD-10-CM | POA: Diagnosis not present

## 2022-01-09 DIAGNOSIS — H269 Unspecified cataract: Secondary | ICD-10-CM | POA: Diagnosis not present

## 2022-01-09 DIAGNOSIS — I10 Essential (primary) hypertension: Secondary | ICD-10-CM | POA: Diagnosis not present

## 2022-01-09 DIAGNOSIS — E890 Postprocedural hypothyroidism: Secondary | ICD-10-CM | POA: Diagnosis not present

## 2022-01-09 DIAGNOSIS — Z8249 Family history of ischemic heart disease and other diseases of the circulatory system: Secondary | ICD-10-CM | POA: Diagnosis not present

## 2022-01-09 DIAGNOSIS — Z823 Family history of stroke: Secondary | ICD-10-CM | POA: Diagnosis not present

## 2022-01-09 DIAGNOSIS — E785 Hyperlipidemia, unspecified: Secondary | ICD-10-CM | POA: Diagnosis not present

## 2022-01-09 DIAGNOSIS — F17210 Nicotine dependence, cigarettes, uncomplicated: Secondary | ICD-10-CM | POA: Diagnosis not present

## 2022-03-19 ENCOUNTER — Other Ambulatory Visit (HOSPITAL_BASED_OUTPATIENT_CLINIC_OR_DEPARTMENT_OTHER): Payer: Self-pay

## 2022-03-19 MED ORDER — FLUAD QUADRIVALENT 0.5 ML IM PRSY
PREFILLED_SYRINGE | INTRAMUSCULAR | 0 refills | Status: AC
  Filled 2022-03-19: qty 0.5, 1d supply, fill #0

## 2022-06-20 DIAGNOSIS — Z6820 Body mass index (BMI) 20.0-20.9, adult: Secondary | ICD-10-CM | POA: Diagnosis not present

## 2022-06-20 DIAGNOSIS — Z1389 Encounter for screening for other disorder: Secondary | ICD-10-CM | POA: Diagnosis not present

## 2022-06-20 DIAGNOSIS — Z0000 Encounter for general adult medical examination without abnormal findings: Secondary | ICD-10-CM | POA: Diagnosis not present

## 2022-06-25 DIAGNOSIS — I10 Essential (primary) hypertension: Secondary | ICD-10-CM | POA: Diagnosis not present

## 2022-06-25 DIAGNOSIS — F17210 Nicotine dependence, cigarettes, uncomplicated: Secondary | ICD-10-CM | POA: Diagnosis not present

## 2022-06-25 DIAGNOSIS — E039 Hypothyroidism, unspecified: Secondary | ICD-10-CM | POA: Diagnosis not present

## 2022-06-25 DIAGNOSIS — Z6821 Body mass index (BMI) 21.0-21.9, adult: Secondary | ICD-10-CM | POA: Diagnosis not present

## 2022-06-25 DIAGNOSIS — I700 Atherosclerosis of aorta: Secondary | ICD-10-CM | POA: Diagnosis not present

## 2022-08-03 DIAGNOSIS — T2010XA Burn of first degree of head, face, and neck, unspecified site, initial encounter: Secondary | ICD-10-CM | POA: Diagnosis not present

## 2022-08-03 DIAGNOSIS — T23161A Burn of first degree of back of right hand, initial encounter: Secondary | ICD-10-CM | POA: Diagnosis not present

## 2022-08-06 DIAGNOSIS — T2010XA Burn of first degree of head, face, and neck, unspecified site, initial encounter: Secondary | ICD-10-CM | POA: Diagnosis not present

## 2022-08-06 DIAGNOSIS — T23161A Burn of first degree of back of right hand, initial encounter: Secondary | ICD-10-CM | POA: Diagnosis not present

## 2022-08-30 DIAGNOSIS — T2010XD Burn of first degree of head, face, and neck, unspecified site, subsequent encounter: Secondary | ICD-10-CM | POA: Diagnosis not present

## 2022-08-30 DIAGNOSIS — T23161D Burn of first degree of back of right hand, subsequent encounter: Secondary | ICD-10-CM | POA: Diagnosis not present

## 2022-08-30 DIAGNOSIS — I10 Essential (primary) hypertension: Secondary | ICD-10-CM | POA: Diagnosis not present

## 2022-11-01 DIAGNOSIS — Z961 Presence of intraocular lens: Secondary | ICD-10-CM | POA: Diagnosis not present

## 2022-11-01 DIAGNOSIS — H52203 Unspecified astigmatism, bilateral: Secondary | ICD-10-CM | POA: Diagnosis not present

## 2022-11-18 DIAGNOSIS — E890 Postprocedural hypothyroidism: Secondary | ICD-10-CM | POA: Diagnosis not present

## 2022-11-18 DIAGNOSIS — F17210 Nicotine dependence, cigarettes, uncomplicated: Secondary | ICD-10-CM | POA: Diagnosis not present

## 2022-11-18 DIAGNOSIS — I10 Essential (primary) hypertension: Secondary | ICD-10-CM | POA: Diagnosis not present

## 2022-11-18 DIAGNOSIS — M1990 Unspecified osteoarthritis, unspecified site: Secondary | ICD-10-CM | POA: Diagnosis not present

## 2022-11-18 DIAGNOSIS — M5430 Sciatica, unspecified side: Secondary | ICD-10-CM | POA: Diagnosis not present

## 2022-11-18 DIAGNOSIS — E785 Hyperlipidemia, unspecified: Secondary | ICD-10-CM | POA: Diagnosis not present

## 2022-11-18 DIAGNOSIS — I700 Atherosclerosis of aorta: Secondary | ICD-10-CM | POA: Diagnosis not present

## 2023-01-17 DIAGNOSIS — I10 Essential (primary) hypertension: Secondary | ICD-10-CM | POA: Diagnosis not present

## 2023-01-17 DIAGNOSIS — Z23 Encounter for immunization: Secondary | ICD-10-CM | POA: Diagnosis not present

## 2023-01-17 DIAGNOSIS — E039 Hypothyroidism, unspecified: Secondary | ICD-10-CM | POA: Diagnosis not present

## 2023-01-17 DIAGNOSIS — F17210 Nicotine dependence, cigarettes, uncomplicated: Secondary | ICD-10-CM | POA: Diagnosis not present

## 2023-01-17 DIAGNOSIS — R634 Abnormal weight loss: Secondary | ICD-10-CM | POA: Diagnosis not present

## 2023-01-17 DIAGNOSIS — I700 Atherosclerosis of aorta: Secondary | ICD-10-CM | POA: Diagnosis not present

## 2023-03-05 ENCOUNTER — Other Ambulatory Visit (HOSPITAL_BASED_OUTPATIENT_CLINIC_OR_DEPARTMENT_OTHER): Payer: Self-pay

## 2023-03-05 MED ORDER — COVID-19 MRNA VAC-TRIS(PFIZER) 30 MCG/0.3ML IM SUSY
0.3000 mL | PREFILLED_SYRINGE | Freq: Once | INTRAMUSCULAR | 0 refills | Status: AC
  Filled 2023-03-05: qty 0.3, 1d supply, fill #0

## 2023-03-13 DIAGNOSIS — Z03818 Encounter for observation for suspected exposure to other biological agents ruled out: Secondary | ICD-10-CM | POA: Diagnosis not present

## 2023-03-13 DIAGNOSIS — R051 Acute cough: Secondary | ICD-10-CM | POA: Diagnosis not present

## 2023-03-13 DIAGNOSIS — J069 Acute upper respiratory infection, unspecified: Secondary | ICD-10-CM | POA: Diagnosis not present

## 2023-03-26 ENCOUNTER — Other Ambulatory Visit (HOSPITAL_BASED_OUTPATIENT_CLINIC_OR_DEPARTMENT_OTHER): Payer: Self-pay

## 2023-03-26 MED ORDER — RSVPREF3 VAC RECOMB ADJUVANTED 120 MCG/0.5ML IM SUSR
0.5000 mL | Freq: Once | INTRAMUSCULAR | 0 refills | Status: AC
  Filled 2023-03-26: qty 0.5, 1d supply, fill #0

## 2023-07-22 DIAGNOSIS — Z1331 Encounter for screening for depression: Secondary | ICD-10-CM | POA: Diagnosis not present

## 2023-07-22 DIAGNOSIS — Z6820 Body mass index (BMI) 20.0-20.9, adult: Secondary | ICD-10-CM | POA: Diagnosis not present

## 2023-07-22 DIAGNOSIS — Z0000 Encounter for general adult medical examination without abnormal findings: Secondary | ICD-10-CM | POA: Diagnosis not present

## 2023-09-22 DIAGNOSIS — E039 Hypothyroidism, unspecified: Secondary | ICD-10-CM | POA: Diagnosis not present

## 2023-09-22 DIAGNOSIS — I700 Atherosclerosis of aorta: Secondary | ICD-10-CM | POA: Diagnosis not present

## 2023-09-22 DIAGNOSIS — I10 Essential (primary) hypertension: Secondary | ICD-10-CM | POA: Diagnosis not present

## 2023-09-22 DIAGNOSIS — Z6820 Body mass index (BMI) 20.0-20.9, adult: Secondary | ICD-10-CM | POA: Diagnosis not present

## 2023-09-22 DIAGNOSIS — F17210 Nicotine dependence, cigarettes, uncomplicated: Secondary | ICD-10-CM | POA: Diagnosis not present

## 2023-09-22 DIAGNOSIS — Z23 Encounter for immunization: Secondary | ICD-10-CM | POA: Diagnosis not present

## 2023-11-05 DIAGNOSIS — H5213 Myopia, bilateral: Secondary | ICD-10-CM | POA: Diagnosis not present

## 2023-11-05 DIAGNOSIS — Z961 Presence of intraocular lens: Secondary | ICD-10-CM | POA: Diagnosis not present

## 2024-03-09 DIAGNOSIS — I700 Atherosclerosis of aorta: Secondary | ICD-10-CM | POA: Diagnosis not present

## 2024-03-09 DIAGNOSIS — E039 Hypothyroidism, unspecified: Secondary | ICD-10-CM | POA: Diagnosis not present

## 2024-03-09 DIAGNOSIS — Z59869 Financial insecurity, unspecified: Secondary | ICD-10-CM | POA: Diagnosis not present

## 2024-03-09 DIAGNOSIS — I10 Essential (primary) hypertension: Secondary | ICD-10-CM | POA: Diagnosis not present

## 2024-03-09 DIAGNOSIS — E785 Hyperlipidemia, unspecified: Secondary | ICD-10-CM | POA: Diagnosis not present

## 2024-03-09 DIAGNOSIS — Z79890 Hormone replacement therapy (postmenopausal): Secondary | ICD-10-CM | POA: Diagnosis not present

## 2024-03-09 DIAGNOSIS — F17210 Nicotine dependence, cigarettes, uncomplicated: Secondary | ICD-10-CM | POA: Diagnosis not present

## 2024-03-16 ENCOUNTER — Other Ambulatory Visit (HOSPITAL_BASED_OUTPATIENT_CLINIC_OR_DEPARTMENT_OTHER): Payer: Self-pay

## 2024-03-16 MED ORDER — COMIRNATY 30 MCG/0.3ML IM SUSY
0.3000 mL | PREFILLED_SYRINGE | Freq: Once | INTRAMUSCULAR | 0 refills | Status: AC
  Filled 2024-03-16: qty 0.3, 1d supply, fill #0

## 2024-03-16 MED ORDER — FLUZONE HIGH-DOSE 0.5 ML IM SUSY
0.5000 mL | PREFILLED_SYRINGE | Freq: Once | INTRAMUSCULAR | 0 refills | Status: AC
  Filled 2024-03-16: qty 0.5, 1d supply, fill #0

## 2024-03-30 DIAGNOSIS — Z681 Body mass index (BMI) 19 or less, adult: Secondary | ICD-10-CM | POA: Diagnosis not present

## 2024-03-30 DIAGNOSIS — H6123 Impacted cerumen, bilateral: Secondary | ICD-10-CM | POA: Diagnosis not present

## 2024-03-30 DIAGNOSIS — I10 Essential (primary) hypertension: Secondary | ICD-10-CM | POA: Diagnosis not present

## 2024-03-30 DIAGNOSIS — E039 Hypothyroidism, unspecified: Secondary | ICD-10-CM | POA: Diagnosis not present

## 2024-03-30 DIAGNOSIS — I700 Atherosclerosis of aorta: Secondary | ICD-10-CM | POA: Diagnosis not present

## 2024-03-30 DIAGNOSIS — F17210 Nicotine dependence, cigarettes, uncomplicated: Secondary | ICD-10-CM | POA: Diagnosis not present
